# Patient Record
Sex: Male | Born: 1980 | Race: White | Hispanic: No | Marital: Single | Smoking: Never smoker
Health system: Southern US, Community
[De-identification: ages and names within clinical notes are randomized; demographics above are authoritative.]

## PROBLEM LIST (undated history)

## (undated) DIAGNOSIS — T7840XA Allergy, unspecified, initial encounter: Secondary | ICD-10-CM

## (undated) DIAGNOSIS — F32A Depression, unspecified: Secondary | ICD-10-CM

## (undated) DIAGNOSIS — I10 Essential (primary) hypertension: Secondary | ICD-10-CM

## (undated) DIAGNOSIS — F419 Anxiety disorder, unspecified: Secondary | ICD-10-CM

## (undated) HISTORY — DX: Allergy, unspecified, initial encounter: T78.40XA

## (undated) HISTORY — DX: Depression, unspecified: F32.A

## (undated) HISTORY — DX: Anxiety disorder, unspecified: F41.9

## (undated) HISTORY — PX: COLONOSCOPY: SHX174

## (undated) HISTORY — DX: Essential (primary) hypertension: I10

---

## 2022-01-08 LAB — QUANTIFERON-TB GOLD PLUS: QuantiFERON-TB Gold Plus: POSITIVE

## 2022-01-17 ENCOUNTER — Ambulatory Visit (HOSPITAL_COMMUNITY)
Admission: RE | Admit: 2022-01-17 | Discharge: 2022-01-17 | Disposition: A | Discharge status: Home / Self Care | Payer: Self-pay | Source: Ambulatory Visit | Attending: Emergency Medicine | Admitting: Emergency Medicine

## 2022-01-17 ENCOUNTER — Other Ambulatory Visit: Payer: Self-pay

## 2022-01-17 ENCOUNTER — Other Ambulatory Visit (HOSPITAL_COMMUNITY): Payer: Self-pay | Admitting: Occupational Medicine

## 2022-01-17 DIAGNOSIS — R7611 Nonspecific reaction to tuberculin skin test without active tuberculosis: Secondary | ICD-10-CM

## 2022-01-20 ENCOUNTER — Other Ambulatory Visit (HOSPITAL_COMMUNITY): Payer: Self-pay | Admitting: Family Medicine

## 2022-01-20 DIAGNOSIS — R7611 Nonspecific reaction to tuberculin skin test without active tuberculosis: Secondary | ICD-10-CM

## 2022-01-22 ENCOUNTER — Telehealth: Payer: Self-pay | Admitting: Internal Medicine

## 2022-01-22 NOTE — Telephone Encounter (Signed)
Hi Dr. Henrene Pastor,  D.O.D  We received a referral from patients PCP for Colitis. Patient has seen a GI provider in [place] however he is requesting a transfer of care because he has moved to [place].   Records were obtained for you to review and advise on scheduling.    Thanks

## 2022-01-24 NOTE — Telephone Encounter (Signed)
GI COURTESY RECORDS REVIEW AS DOD  Records reviewed.   41 yo with UC followed at [place] clinic. Now relocated to [facility] and needs a primary GI Doc.  Reasonable request. As my practice is too busy to accommodate, I would recommend that he establish with one of our newer partners.  Please schedule routine visit with Dr. Lorenso Courier. I will copy her on this correspondence and forward the records to her.  Thanks,   Docia Chuck. Geri Seminole., M.D. [facility] of Gastroenterology

## 2022-01-28 NOTE — Telephone Encounter (Signed)
Spoke with patient and he said he will be due for his Remicade infusions will it be okay if I schedule with an APP your next available is 02/26/22.

## 2022-01-30 ENCOUNTER — Encounter: Payer: Self-pay | Admitting: Gastroenterology

## 2022-01-30 NOTE — Telephone Encounter (Signed)
Called patient to schedule left voicemail. 

## 2022-02-05 ENCOUNTER — Ambulatory Visit: Payer: Self-pay | Admitting: Family Medicine

## 2022-02-12 ENCOUNTER — Ambulatory Visit: Payer: No Typology Code available for payment source | Admitting: Gastroenterology

## 2022-02-12 ENCOUNTER — Telehealth: Payer: Self-pay

## 2022-02-12 ENCOUNTER — Encounter: Payer: Self-pay | Admitting: Gastroenterology

## 2022-02-12 ENCOUNTER — Other Ambulatory Visit: Payer: Self-pay

## 2022-02-12 VITALS — BP 152/88 | HR 96 | Ht 70.0 in | Wt 275.0 lb

## 2022-02-12 DIAGNOSIS — K51818 Other ulcerative colitis with other complication: Secondary | ICD-10-CM

## 2022-02-12 DIAGNOSIS — K51919 Ulcerative colitis, unspecified with unspecified complications: Secondary | ICD-10-CM | POA: Insufficient documentation

## 2022-02-12 DIAGNOSIS — K5190 Ulcerative colitis, unspecified, without complications: Secondary | ICD-10-CM

## 2022-02-12 HISTORY — DX: Ulcerative colitis, unspecified, without complications: K51.90

## 2022-02-12 NOTE — Telephone Encounter (Signed)
Orders placed in Infusion Navigator with [facility] for Entyvio. Pharmacist to begin auth tomorrow (02/12/22). Will await response. ?

## 2022-02-12 NOTE — Telephone Encounter (Signed)
-----   Message from Loralie Champagne, PA-C sent at 02/12/2022  1:04 PM EDT ----- ?I saw this patient in clinic this morning.  He is new to our office, has been on Entyvio infusions for his UC.  His care is being assigned to Dr. Lorenso Courier.  We discussed about possibly switching to Rinvoq as he expressed interest in this, however, I forgot that Dr. Lorenso Courier is out of the country, does not return until 3/27.  That being said, he is already overdue for his Entyvio.  He typically gets that infusion every 4 weeks.  Can we please try to get that set up for him so that he can get restarted on something and then when she returns I will discussed with her regarding possible switch to Rinvoq? ? ?Thank you, ? ?Jess ? ?

## 2022-02-12 NOTE — Progress Notes (Addendum)
? ? ? ?02/12/2022 ?Zachary [name] ?[id number] ?[birth year] ? ? ?HISTORY OF PRESENT ILLNESS: This is a 41 year old male who has ulcerative colitis.  He is coming to our office to establish care as he recently moved here from [place], now works for [employer].  He was following with [facility], Dr. Martina Sinner.  He was diagnosed with ulcerative colitis in 2019.  Initial colonoscopy showed severely active chronic colitis from the rectum to the transverse colon.  He was initially started on mesalamine and then Humira, which he says he took for about 1.5 years, however he developed antibodies and this was stopped.  He was then switched to [facility] around January 2021 at which time he was also hospitalized for his symptoms.  He tells me colonoscopy was repeated at that time.  He says that he does not feel like the Entyvio ever did as well for him as the Humira had.  In September 2022 his Entyvio levels were found to be low so his interval was decreased from every 8 weeks to every 4 weeks.  He was also placed on Uceris 9 mg daily and has continued on ever since that time.  His last Entyvio infusion was at the end of January, so he is overdue for that and is beginning to have flares of his symptoms and his disease.  He is interested and open to discussion in regards to switching to Rinvoq though as that is oral and he would rather take something like that if possible. ? ?Recently had QuantiFERON gold drawn for [employer] employment and we did receive those results.  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 ? ?He tells me that he is having several bowel movements a day, some diarrhea, some normal.  He is not seeing any blood and he is not having any abdominal pain.   ? ?I reviewed records from [place].  I do not have either of his colonoscopy records as I believe those were both performed at his  initial place of care, prior to switching to [facility] so we will work on getting those results. ? ? ?Past Medical History:  ?Diagnosis Date  ? Hypertension   ? Ulcerative colitis ([place])   ? ? reports that he has never smoked. He has been exposed to tobacco smoke. He has quit using smokeless tobacco. He reports current alcohol use. He reports that he does not use drugs. ?family history includes COPD in his mother; Colon cancer in his cousin; Healthy in his father; Ovarian cancer in his paternal aunt. ?No Known Allergies ? ?  ?Outpatient Encounter Medications as of 02/12/2022  ?Medication Sig  ? amLODipine (NORVASC) 10 MG tablet Take 1 tablet by mouth daily.  ? Budesonide ER 9 MG TB24 Take 1 tablet by mouth daily at 12 noon.  ? lisinopril (ZESTRIL) 40 MG tablet Take 1 tablet by mouth daily at 12 noon.  ? Multiple Vitamin (MULTI-VITAMIN) tablet Take 1 tablet by mouth daily.  ? sertraline (ZOLOFT) 50 MG tablet Take 2 tablets by mouth daily.  ? vedolizumab (ENTYVIO) 300 MG injection Inject 5 mLs into the vein every 30 (thirty) days. Every 4 weeks.  ? ?No facility-administered encounter medications on file as of 02/12/2022.  ? ? ? ?REVIEW OF SYSTEMS  : All other systems reviewed and negative except where noted in the History of Present Illness. ? ? ?  PHYSICAL EXAM: ?BP (!) 152/88   Pulse 96   Ht 5' 10"  (1.778 m)   Wt 275 lb (124.7 kg)   BMI 39.46 kg/m?  ?General: Well developed white male in no acute distress ?Head: Normocephalic and atraumatic ?Eyes:  Sclerae anicteric, conjunctiva pink. ?Ears: Normal auditory acuity ?Lungs: Clear throughout to auscultation; no W/R/R. ?Heart: Regular rate and rhythm; no M/R/G. ?Abdomen: Soft, non-distended.  BS present.  Non-tender. ?Musculoskeletal: Symmetrical with no gross deformities  ?Skin: No lesions on visible extremities ?Extremities: No edema  ?Neurological: Alert oriented x 4, grossly non-focal ?Psychological:  Alert and cooperative. Normal mood and  affect ? ?ASSESSMENT AND PLAN: ?*Ulcerative colitis on Entyvio, initially every 8 weeks starting around January 2021, but then [facility] level/titers were low back in September/October 2022 so the interval was decreased to every 4 weeks (I see notes from the physician saying they were low, but I cannot see the actual results).  He has also been on budesonide since that time as well.  Last Entyvio infusion was towards the end of January, so he is overdue for that.  Now starting to have more symptoms again.  He expressed interest in possibly trying Rinvoq as it is oral.  He is not sure that he has ever done extremely well on the Entyvio.  Has now been on budesonide as well since October.  Dr. Lorenso Courier is out of the country for the next couple weeks.  I am going to go ahead and initiate trying to get him back on Entyvio so he can get his infusion and then I will discuss with her further regarding switching to something different, possibly Rinvoq, when she returns. ? ?**Addendum: I did receive previous colonoscopy records.  Not typical colonoscopy reports, but April 2019 they reported colitis with biopsy showing moderate to severe chronic active colitis with ulceration.  They describe crypt distortion associated with increased lymphocytes, plasma cells, and lymphoid aggregates within the lamina propria. ?Colonoscopy from January 21 for evaluation of ulcerative colitis showed colitis with random biopsy showing diffuse colitis with neutrophilic cryptitis, distortion of crypt architecture, moderate mononuclear cell expansion of the lamina propria and basal plasmacytosis consistent with moderately active ulcerative colitis.  No dysplasia present.  Records are being sent for scanning. ? ? ?CC:  No ref. provider found ? ?  ?

## 2022-02-12 NOTE — Patient Instructions (Signed)
If you are age 42 or older, your body mass index should be between 23-30. Your Body mass index is 39.46 kg/m?Marland Kitchen If this is out of the aforementioned range listed, please consider follow up with your Primary Care Provider. ? ?If you are age 46 or younger, your body mass index should be between 19-25. Your Body mass index is 39.46 kg/m?Marland Kitchen If this is out of the aformentioned range listed, please consider follow up with your Primary Care Provider.  ? ? ?The [facility] providers would like to encourage you to use [facility] to communicate with providers for non-urgent requests or questions.  Due to long hold times on the telephone, sending your provider a message by [facility] may be a faster and more efficient way to get a response.  Please allow 48 business hours for a response.  Please remember that this is for non-urgent requests.  ? ?It was a pleasure to see you today! ? ?Thank you for trusting me with your gastrointestinal care!   ? ?Alonza Bogus, PA-C  ?

## 2022-02-12 NOTE — Telephone Encounter (Signed)
Ammie please see the note from Alonza Bogus PA.  She would like this pt to get Weyman Rodney (he is overdue) He will be Dr Libby Maw pt.   ?

## 2022-02-13 ENCOUNTER — Telehealth: Payer: Self-pay | Admitting: Pharmacy Technician

## 2022-02-13 ENCOUNTER — Other Ambulatory Visit: Payer: Self-pay | Admitting: Family Medicine

## 2022-02-13 ENCOUNTER — Other Ambulatory Visit: Payer: Self-pay | Admitting: Pharmacy Technician

## 2022-02-13 NOTE — Telephone Encounter (Signed)
Ammie and Janett Billow I have been out of the loop on this pt.  I am not sure what if anything I need to do.  The pt states his previous GI has faxed records.  Let me me know if I need to do anything. ?

## 2022-02-13 NOTE — Telephone Encounter (Signed)
Zachary [name] have you seen the Xray? ?

## 2022-02-13 NOTE — Telephone Encounter (Signed)
Response sent to Koren Shiver, RN ?

## 2022-02-13 NOTE — Telephone Encounter (Signed)
CXR results received. Results have been labeled and placed in scans file for scanning purposes.  ?

## 2022-02-13 NOTE — Telephone Encounter (Signed)
Thank you Ammie- This was a Dr Lorenso Courier pt correct?  I was trying to follow the thread and I think I got lost.  As long as the pt is taken care of.  Thank you  ?

## 2022-02-13 NOTE — Telephone Encounter (Signed)
Auth Submission: PENDING ?Payer: [employer] ?Medication & CPT/J Code(s) submitted: Entyvio (Vedolizumab) [id number] ?Route of submission (phone, fax, portal): PHONE: [phone] ?FAX: [phone] ?Submitted via [place]  ?Clinicals faxed: [phone] ?Auth type: Buy/Bill ?Units/visits requested: entyvio 363m ?Reference number: [id number]? ?Will update once we receive a response. ? ?  ?

## 2022-02-19 ENCOUNTER — Other Ambulatory Visit: Payer: Self-pay | Admitting: Pharmacy Technician

## 2022-02-19 NOTE — Telephone Encounter (Signed)
Notified pharmacist, Tresa Res, for status update. States still pending at this time. Will update once they have received a response. ?

## 2022-02-20 NOTE — Telephone Encounter (Signed)
Zachary [name] has been approved. Pt will be scheduled for infusion by [facility]. Please advise when you would like to see him in follow following infusion and if any labs will be required at the time of his f/u. ?

## 2022-02-20 NOTE — Telephone Encounter (Signed)
Dr. Myrtice Lauth, ?Auth Submission: APPROVED ?Payer: [employer] ?Medication & CPT/J Code(s) submitted: Entyvio (Vedolizumab) [id number] ?Route of submission (phone, fax, portal): PQSOX:[phone] ?Auth type: Buy/Bill ?Units/visits requested: 300 MG Q4WKS ?Reference number: [id number] ?Approval from: 02/19/22 to 05/22/22  ? ?Patient will be scheduled immediately. ?Zachary [name]

## 2022-02-23 NOTE — Progress Notes (Signed)
I agree with the assessment and plan as outlined by Ms. Myrtice Lauth. I recommend continuing the patient on Entyvio for now since I would like to ensure that he has failed Entyvio prior to switching to another medication. If we do confirm that he has failed Entyvio, then would be okay to switch to Rinvoq in the future. ?

## 2022-02-24 ENCOUNTER — Encounter: Payer: Self-pay | Admitting: Family Medicine

## 2022-02-24 ENCOUNTER — Ambulatory Visit: Payer: No Typology Code available for payment source

## 2022-02-24 DIAGNOSIS — F32A Depression, unspecified: Secondary | ICD-10-CM

## 2022-02-24 NOTE — Telephone Encounter (Signed)
Pt has been scheduled for 1 mo f/u with Dr. Lorenso Courier on 03/28/22 @ 3pm. My Chart message sent informing pt of date/time of appt. ?

## 2022-02-25 ENCOUNTER — Ambulatory Visit (INDEPENDENT_AMBULATORY_CARE_PROVIDER_SITE_OTHER): Payer: No Typology Code available for payment source

## 2022-02-25 ENCOUNTER — Other Ambulatory Visit: Payer: Self-pay

## 2022-02-25 VITALS — BP 151/101 | HR 94 | Temp 98.2°F | Resp 18 | Ht 72.0 in | Wt 275.4 lb

## 2022-02-25 DIAGNOSIS — K51819 Other ulcerative colitis with unspecified complications: Secondary | ICD-10-CM | POA: Diagnosis not present

## 2022-02-25 MED ORDER — VEDOLIZUMAB 300 MG IV SOLR
300.0000 mg | Freq: Once | INTRAVENOUS | Status: AC
  Administered 2022-02-25: 300 mg via INTRAVENOUS
  Filled 2022-02-25: qty 5

## 2022-02-25 NOTE — Telephone Encounter (Signed)
Appears pt has viewed My Chart message pertaining to follow up appt as below: ? ?Last read by Genevive Bi "Chris" at  1:25 PM on 02/24/2022. ?

## 2022-02-25 NOTE — Progress Notes (Signed)
Diagnosis: Ulcerative colitis ? ?Provider:  Marshell Garfinkel, MD ? ?Procedure: Infusion ? ?IV Type: Peripheral, IV Location: L Antecubital ? ?Entyvio (Vedolizumab),  Dose: 300 mg ? ?Infusion Start Time: 4818 ? ?Infusion Stop Time: 1139 ? ?Post Infusion IV Care: Peripheral IV Discontinued ? ?Discharge: Condition: Good, Destination: Home . AVS provided to patient.  ? ?Performed by:  Koren Shiver, RN  ?  ?

## 2022-02-26 NOTE — Telephone Encounter (Signed)
Entyvio approved. Pt scheduled for infusion 03/25/22 and f/u with Dr. Lorenso Courier 03/28/22. ?

## 2022-02-26 NOTE — Progress Notes (Signed)
Following message received from Alonza Bogus, PA-C: ? ?Please let him know that Dr. Lorenso Courier would like him to stay on Entyvio for now.  If we feel that he fails that at some point then can try Rinvoq.  Please be sure that he keeps his follow-up with Dr. Lorenso Courier in April.  I am assuming that he got his Entyvio infusion.  How is he feeling?  ? ?Thank you,  ? ?Jess ? ?My Chart message sent as follows: ? ?Hi Zachary [name] ?  ?Dr. Lorenso Courier has reviewed your records and would like you to stay on [facility] for now.  If she feels that this biologic is not treating your Ulcerative Colitis symptoms as adequately as needed, at some point she will recommend Rinvoq.  Please be sure to keep your follow-up appointment with Dr. Lorenso Courier in April. Since your Entyvio infusion on 3/27, how have you been feeling?  ? ?Thank you ? ?Awaiting pt response  ?

## 2022-02-27 ENCOUNTER — Ambulatory Visit: Payer: Self-pay | Admitting: Family Medicine

## 2022-02-27 NOTE — Telephone Encounter (Signed)
Appt has been rescheduled.  ?

## 2022-03-06 ENCOUNTER — Other Ambulatory Visit (HOSPITAL_COMMUNITY): Payer: Self-pay

## 2022-03-06 ENCOUNTER — Encounter: Payer: Self-pay | Admitting: Internal Medicine

## 2022-03-06 MED ORDER — SERTRALINE HCL 50 MG PO TABS
100.0000 mg | ORAL_TABLET | Freq: Every day | ORAL | 0 refills | Status: DC
  Filled 2022-03-06: qty 30, 15d supply, fill #0

## 2022-03-20 ENCOUNTER — Other Ambulatory Visit (HOSPITAL_BASED_OUTPATIENT_CLINIC_OR_DEPARTMENT_OTHER): Payer: Self-pay

## 2022-03-20 ENCOUNTER — Encounter: Payer: Self-pay | Admitting: Internal Medicine

## 2022-03-20 ENCOUNTER — Ambulatory Visit (INDEPENDENT_AMBULATORY_CARE_PROVIDER_SITE_OTHER): Payer: No Typology Code available for payment source | Admitting: Family Medicine

## 2022-03-20 ENCOUNTER — Encounter: Payer: Self-pay | Admitting: Family Medicine

## 2022-03-20 VITALS — BP 147/98 | HR 104 | Ht 72.0 in | Wt 274.2 lb

## 2022-03-20 DIAGNOSIS — F32A Depression, unspecified: Secondary | ICD-10-CM

## 2022-03-20 DIAGNOSIS — E669 Obesity, unspecified: Secondary | ICD-10-CM | POA: Diagnosis not present

## 2022-03-20 DIAGNOSIS — I10 Essential (primary) hypertension: Secondary | ICD-10-CM | POA: Diagnosis not present

## 2022-03-20 DIAGNOSIS — F419 Anxiety disorder, unspecified: Secondary | ICD-10-CM | POA: Diagnosis not present

## 2022-03-20 MED ORDER — WEGOVY 0.25 MG/0.5ML ~~LOC~~ SOAJ
0.2500 mg | SUBCUTANEOUS | 1 refills | Status: DC
  Filled 2022-03-20: qty 2, 28d supply, fill #0

## 2022-03-20 MED ORDER — LISINOPRIL 40 MG PO TABS
60.0000 mg | ORAL_TABLET | Freq: Every day | ORAL | 1 refills | Status: DC
  Filled 2022-03-20: qty 135, 90d supply, fill #0
  Filled 2022-06-17 – 2022-07-24 (×2): qty 135, 90d supply, fill #1

## 2022-03-20 MED ORDER — SERTRALINE HCL 100 MG PO TABS
100.0000 mg | ORAL_TABLET | Freq: Every day | ORAL | 1 refills | Status: DC
  Filled 2022-03-20: qty 90, 90d supply, fill #0
  Filled 2022-05-13 – 2022-06-04 (×2): qty 90, 90d supply, fill #1

## 2022-03-20 NOTE — Assessment & Plan Note (Signed)
Blood pressure is not at goal for age and co-morbidities.  I recommend amlodipine 10 mg daily and lisinopril 60 mg daily.   ?- BP goal <130/80 ?- monitor and log blood pressures at home ?- check around the same time each day in a relaxed setting ?- Limit salt to <2000 mg/day ?- Follow DASH eating plan (heart healthy diet) ?- limit alcohol to 2 standard drinks per day for men and 1 per day for women ?- avoid tobacco products ?- get at least 2 hours of regular aerobic exercise weekly ?Patient aware of signs/symptoms requiring further/urgent evaluation. ?Labs updated today. ?If adding the extra half tab of lisinopril doesn't make much difference at follow-up, then reduce back to 40 mg and consider adding a third agent like hydrochlorothiazide.  ?

## 2022-03-20 NOTE — Assessment & Plan Note (Signed)
Well controlled on Zoloft 100 mg. No SI/HI. Refills provided. No new concerns. ?

## 2022-03-20 NOTE — Patient Instructions (Addendum)
Blood pressure is not at goal for age and co-morbidities.  I recommend amlodipine 10 mg daily and lisinopril 60 mg daily.   ?- BP goal <130/80 ?- monitor and log blood pressures at home ?- check around the same time each day in a relaxed setting ?- Limit salt to <2000 mg/day ?- Follow DASH eating plan (heart healthy diet) ?- limit alcohol to 2 standard drinks per day for men and 1 per day for women ?- avoid tobacco products ?- get at least 2 hours of regular aerobic exercise weekly ?Patient aware of signs/symptoms requiring further/urgent evaluation. ?Labs updated today. ?If adding the extra half tab of lisinopril doesn't make much difference at follow-up, then reduce back to 40 mg and consider adding a third agent like hydrochlorothiazide.  ? ? ? ?Thank you for choosing  Primary Care at [facility] for your Primary Care needs. I am excited for the opportunity to partner with you to meet your health care goals. It was a pleasure meeting you today! ? ? ?Information on diet, exercise, and health maintenance recommendations are listed below. This is information to help you be sure you are on track for optimal health and monitoring.  ? ?Please look over this and let us know if you have any questions or if you have completed any of the health maintenance outside of [place] so that we can be sure your records are up to date.  ?___________________________________________________________ ? ?MyChart:  ?For all urgent or time sensitive needs we ask that you please call the office to avoid delays. Our number is [phone]. ?MyChart is not constantly monitored and due to the large volume of messages a day, replies may take up to 72 business hours. ? ?MyChart Policy: ?MyChart allows for you to see your visit notes, after visit summary, provider recommendations, lab and tests results, make an appointment, request refills, and contact your provider or the office for non-urgent questions or concerns.  Providers are seeing patients during normal business hours and do not have built in time to review MyChart messages.  ?We ask that you allow a minimum of 3 business days for responses to [employer]. For this reason, please do not send urgent requests through [place]. Please call the office at [phone]. ?New and ongoing conditions may require a visit. We have virtual and in-person visits available for your convenience.  ?Complex MyChart concerns may require a visit. Your provider may request you schedule a virtual or in-person visit to ensure we are providing the best care possible. ?MyChart messages sent after 11:00 AM on Friday will not be received by the provider until Monday morning.  ?  ?Lab and Test Results: ?You will receive your lab and test results on MyChart as soon as they are completed and results have been sent by the lab or testing facility. Due to this service, you will receive your results BEFORE your provider.  ?I review lab and test results each morning prior to seeing patients. Some results require collaboration with other providers to ensure you are receiving the most appropriate care. For this reason, we ask that you please allow a minimum of 3-5 business days from the time that ALL results have been received for your provider to receive and review lab and test results and contact you about these.  ?Most lab and test result comments from the provider will be sent through [place]. Your provider may recommend changes to the plan of care, follow-up visits, repeat testing, ask questions, or  request an office visit to discuss these results. You may reply directly to this message or call the office to provide information for the provider or set up an appointment. ?In some instances, you will be called with test results and recommendations. Please let us know if this is preferred and we will make note of this in your chart to provide this for you.    ?If you have not heard a response to your  lab or test results in 5 business days from all results returning to [place], please call the office to let us know. We ask that you please avoid calling prior to this time unless there is an emergent concern. Due to high call volumes, this can delay the resulting process. ? ?After Hours: ?For all non-emergency after hours needs, please call the office at [phone] and select the option to reach the on-call  service. On-call services are shared between multiple [place] offices and therefore it will not be possible to speak directly with your provider. On-call providers may provide medical advice and recommendations, but are unable to provide refills for maintenance medications.  ?For all emergency or urgent medical needs after normal business hours, we recommend that you seek care at the closest Urgent Care or Emergency Department to ensure appropriate treatment in a timely manner.  ?[facility] [place] has a 24 hour emergency room located on the ground floor for your convenience.  ? ?Urgent Concerns During the Business Day ?Providers are seeing patients from 8AM to [place] with a busy schedule and are most often not able to respond to non-urgent calls until the end of the day or the next business day. ?If you should have URGENT concerns during the day, please call and speak to the nurse or schedule a same day appointment so that we can address your concern without delay.  ? ?Thank you, again, for choosing me as your health care partner. I appreciate your trust and look forward to learning more about you.  ? ?Purcell Nails. Olevia Bowens, DNP, FNP-C ? ?___________________________________________________________ ? ?Health Maintenance Recommendations ?Screening Testing ?Mammogram ?Every 1-2 years based on history and risk factors ?Starting at age 53 ?Pap Smear ?Ages 21-39 every 3 years ?Ages 2-65 every 5 years with HPV testing ?More frequent testing may be required based on results and history ?Colon Cancer  Screening ?Every 1-10 years based on test performed, risk factors, and history ?Starting at age 12 ?Bone Density Screening ?Every 2-10 years based on history ?Starting at age 60 for women ?Recommendations for men differ based on medication usage, history, and risk factors ?AAA Screening ?One time ultrasound ?Men 80-55 years old who have ever smoked ?Lung Cancer Screening ?Low Dose Lung CT every 12 months ?Age 13-80 years with a 20 pack-year smoking history who still smoke or who have quit within the last 15 years ? ?Screening Labs ?Routine  Labs: Complete Blood Count (CBC), Complete Metabolic Panel (CMP), Cholesterol (Lipid Panel) ?Every 6-12 months based on history and medications ?May be recommended more frequently based on current conditions or previous results ?Hemoglobin A1c Lab ?Every 3-12 months based on history and previous results ?Starting at age 51 or earlier with diagnosis of diabetes, high cholesterol, BMI >26, and/or risk factors ?Frequent monitoring for patients with diabetes to ensure blood sugar control ?Thyroid Panel (TSH w/ T3 & T4) ?Every 6 months based on history, symptoms, and risk factors ?May be repeated more often if on medication ?HIV ?One time testing for all patients 25 and older ?  May be repeated more frequently for patients with increased risk factors or exposure ?Hepatitis C ?One time testing for all patients 46 and older ?May be repeated more frequently for patients with increased risk factors or exposure ?Gonorrhea, Chlamydia ?Every 12 months for all sexually active persons 13-24 years ?Additional monitoring may be recommended for those who are considered high risk or who have symptoms ?PSA ?Men 52-34 years old with risk factors ?Additional screening may be recommended from age 13-69 based on risk factors, symptoms, and history ? ?Vaccine Recommendations ?Tetanus Booster ?All adults every 10 years ?Flu Vaccine ?All patients 6 months and older every year ?COVID Vaccine ?All patients 12  years and older ?Initial dosing with booster ?May recommend additional booster based on age and health history ?HPV Vaccine ?2 doses all patients age 13-26 ?Dosing may be considered for patients over 26 ?Shingles

## 2022-03-20 NOTE — Assessment & Plan Note (Signed)
He has been working on [employer], portion control and exercise without much improvement yet. Updating labs today. Interested in trying Wegovy - will send in (handout provided).  ?

## 2022-03-20 NOTE — Progress Notes (Signed)
? ?New Patient Office Visit ? ?Subjective   ? ?Patient ID: Zachary [name], male    DOB: 1981-09-04  Age: 41 y.o. MRN: [id number] ? ?CC:  ?Chief Complaint  ?Patient presents with  ? Establish Care  ? Hypertension  ? ? ?HPI ?[name] presents to establish care. 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  ? ?HTN: Patient reports history of HTN and currently taking amlodipine 10 mg daily and lisinopril 40 mg daily. States he has not been monitoring regularly at home, but has been getting high readings at recent doctor's appointments. Patient denies any chest pain, palpitations, dyspnea, wheezing, edema, recurrent headaches, vision changes.  ? ?Anxiety/depression: Patient reports history of anxiety/depression that worsened after his mom died last year. He has been taking 100 mg Zoloft and feels like this helping significantly. No side effects. He denies any SI/HI.  ? ?Obesity: Patient reports he has gained a lot of weight over the past few years with slacking off on diet/exercise and grieving his mom's death. He has started back trying to focus on healthy diet and exercise, but feels that he may need a little help. He is motivated to get back in shape and be able to run a 5K eventually.  ?  ? ? ? ?Outpatient Encounter Medications as of 03/20/2022  ?Medication Sig  ? amLODipine (NORVASC) 10 MG tablet Take 1 tablet by mouth daily.  ? Budesonide ER 9 MG TB24 Take 1 tablet by mouth daily at 12 noon.  ? Multiple Vitamin (MULTI-VITAMIN) tablet Take 1 tablet by mouth daily.  ? Semaglutide-Weight Management (WEGOVY) 0.25 MG/0.5ML SOAJ Inject 0.25 mg into the skin once a week. After 4 weeks, increase to 0.5 mg weekly.  ? vedolizumab (ENTYVIO) 300 MG injection Inject 5 mLs into the vein every 30 (thirty) days. Every 4 weeks.  ? [DISCONTINUED] lisinopril (ZESTRIL) 40 MG tablet Take 1 tablet by mouth daily at 12 noon.  ? [DISCONTINUED] sertraline (ZOLOFT) 50 MG tablet Take 2  tablets (100 mg total) by mouth daily for 15 days.  ? lisinopril (ZESTRIL) 40 MG tablet Take 1 & 1/2 tablets (60 mg total) by mouth daily at 12 noon.  ? sertraline (ZOLOFT) 100 MG tablet Take 1 tablet (100 mg total) by mouth daily.  ? ?No facility-administered encounter medications on file as of 03/20/2022.  ? ? ?Past Medical History:  ?Diagnosis Date  ? Hypertension   ? Ulcerative colitis ([place])   ? Ulcerative colitis ([place]) 02/12/2022  ? ? ?Past Surgical History:  ?Procedure Laterality Date  ? COLONOSCOPY    ? ? ?Family History  ?Problem Relation Age of Onset  ? COPD Mother   ? Healthy Father   ? Ovarian cancer Paternal Aunt   ? Colon cancer Cousin   ? Colon polyps Neg Hx   ? ? ?Social History  ? ?Socioeconomic History  ? Marital status: Single  ?  Spouse name: Not on file  ? Number of children: Not on file  ? Years of education: Not on file  ? Highest education level: Not on file  ?Occupational History  ? Not on file  ?Tobacco Use  ? Smoking status: Never  ?  Passive exposure: Past  ? Smokeless tobacco: Former  ?Vaping Use  ? Vaping Use: Never used  ?Substance and Sexual Activity  ? Alcohol use: Yes  ?  Comment: occasional  ? Drug use: Never  ? Sexual activity: Not on file  ?Other Topics Concern  ?  Not on file  ?Social History Narrative  ? Not on file  ? ?Social Determinants of Health  ? ?Financial Resource Strain: Not on file  ?Food Insecurity: Not on file  ?Transportation Needs: Not on file  ?Physical Activity: Not on file  ?Stress: Not on file  ?Social Connections: Not on file  ?Intimate Partner Violence: Not on file  ? ? ?ROS ?All review of systems negative except what is listed in the HPI ? ?  ? ? ?Objective   ? ?BP (!) 147/98   Pulse (!) 104   Ht 6' (1.829 m)   Wt 274 lb 3.2 oz (124.4 kg)   BMI 37.19 kg/m?  ? ?Physical Exam ?Vitals reviewed.  ?Constitutional:   ?   General: He is not in acute distress. ?   Appearance: Normal appearance. He is obese. He is not ill-appearing.  ?HENT:  ?   Head: Normocephalic  and atraumatic.  ?Cardiovascular:  ?   Rate and Rhythm: Normal rate and regular rhythm.  ?Pulmonary:  ?   Effort: Pulmonary effort is normal.  ?   Breath sounds: Normal breath sounds.  ?Skin: ?   General: Skin is warm and dry.  ?Neurological:  ?   General: No focal deficit present.  ?   Mental Status: He is alert and oriented to person, place, and time. Mental status is at baseline.  ?Psychiatric:     ?   Mood and Affect: Mood normal.     ?   Behavior: Behavior normal.     ?   Thought Content: Thought content normal.     ?   Judgment: Judgment normal.  ? ? ? ?  ? ?Assessment & Plan:  ? ?1. Anxiety and depression ?Well controlled on Zoloft 100 mg. No SI/HI. Refills provided. No new concerns. ?- sertraline (ZOLOFT) 100 MG tablet; Take 1 tablet (100 mg total) by mouth daily.  Dispense: 90 tablet; Refill: 1 ? ?2. Primary hypertension ?Blood pressure is not at goal for age and co-morbidities.  I recommend amlodipine 10 mg daily and lisinopril 60 mg daily.   ?- BP goal <130/80 ?- monitor and log blood pressures at home ?- check around the same time each day in a relaxed setting ?- Limit salt to <2000 mg/day ?- Follow DASH eating plan (heart healthy diet) ?- limit alcohol to 2 standard drinks per day for men and 1 per day for women ?- avoid tobacco products ?- get at least 2 hours of regular aerobic exercise weekly ?Patient aware of signs/symptoms requiring further/urgent evaluation. ?Labs updated today. ?If adding the extra half tab of lisinopril doesn't make much difference at follow-up, then reduce back to 40 mg and consider adding a third agent like hydrochlorothiazide.  ? ?- Hemoglobin A1c ?- CBC ?- Comprehensive metabolic panel ?- Lipid panel ?- TSH ?- lisinopril (ZESTRIL) 40 MG tablet; Take 1 & 1/2 tablets (60 mg total) by mouth daily at 12 noon.  Dispense: 135 tablet; Refill: 1 ? ?3. Obesity (BMI 35.0-39.9 without comorbidity) ?He has been working on [employer], portion control and exercise without much  improvement yet. Updating labs today. Interested in trying Wegovy - will send in (handout provided).  ? ?- Hemoglobin A1c ?- CBC ?- Comprehensive metabolic panel ?- Lipid panel ?- TSH ?- Semaglutide-Weight Management (WEGOVY) 0.25 MG/0.5ML SOAJ; Inject 0.25 mg into the skin once a week. After 4 weeks, increase to 0.5 mg weekly.  Dispense: 2 mL; Refill: 1 ? ? ? ?Return in about 2 weeks (  around 04/03/2022) for HTN f/u Lovena Le .  ? ?Terrilyn Saver, NP ? ? ?

## 2022-03-21 ENCOUNTER — Telehealth: Payer: Self-pay

## 2022-03-21 ENCOUNTER — Other Ambulatory Visit (HOSPITAL_BASED_OUTPATIENT_CLINIC_OR_DEPARTMENT_OTHER): Payer: Self-pay

## 2022-03-21 LAB — LIPID PANEL
Cholesterol: 169 mg/dL (ref 0–200)
HDL: 53.6 mg/dL (ref >39.00)
LDL Cholesterol: 76 mg/dL (ref 0–99)
NonHDL: 115.67
Total CHOL/HDL Ratio: 3
Triglycerides: 200 mg/dL — ABNORMAL HIGH (ref 0.0–149.0)
VLDL: 40 mg/dL (ref 0.0–40.0)

## 2022-03-21 LAB — COMPREHENSIVE METABOLIC PANEL
ALT: 33 U/L (ref 0–53)
AST: 29 U/L (ref 0–37)
Albumin: 4.1 g/dL (ref 3.5–5.2)
Alkaline Phosphatase: 50 U/L (ref 39–117)
BUN: 26 mg/dL — ABNORMAL HIGH (ref 6–23)
CO2: 24 mEq/L (ref 19–32)
Calcium: 9.3 mg/dL (ref 8.4–10.5)
Chloride: 103 mEq/L (ref 96–112)
Creatinine, Ser: 1.25 mg/dL (ref 0.40–1.50)
GFR: 71.04 mL/min (ref >60.00)
Glucose, Bld: 80 mg/dL (ref 70–99)
Potassium: 3.5 mEq/L (ref 3.5–5.1)
Sodium: 139 mEq/L (ref 135–145)
Total Bilirubin: 0.5 mg/dL (ref 0.2–1.2)
Total Protein: 7.2 g/dL (ref 6.0–8.3)

## 2022-03-21 LAB — CBC
HCT: 41.2 % (ref 39.0–52.0)
Hemoglobin: 13.5 g/dL (ref 13.0–17.0)
MCHC: 32.8 g/dL (ref 30.0–36.0)
MCV: 94.7 fl (ref 78.0–100.0)
Platelets: 324 10*3/uL (ref 150.0–400.0)
RBC: 4.35 Mil/uL (ref 4.22–5.81)
RDW: 13.2 % (ref 11.5–15.5)
WBC: 11.6 10*3/uL — ABNORMAL HIGH (ref 4.0–10.5)

## 2022-03-21 LAB — TSH: TSH: 1.15 u[IU]/mL (ref 0.35–5.50)

## 2022-03-21 LAB — HEMOGLOBIN A1C: Hgb A1c MFr Bld: 6.1 % (ref 4.6–6.5)

## 2022-03-21 NOTE — Telephone Encounter (Signed)
PA initiated via Covermymeds; KEY: [id number]. Awaiting determination.  ?

## 2022-03-24 ENCOUNTER — Other Ambulatory Visit (HOSPITAL_BASED_OUTPATIENT_CLINIC_OR_DEPARTMENT_OTHER): Payer: Self-pay

## 2022-03-24 NOTE — Telephone Encounter (Signed)
PA approved.  ? ? The request has been approved. The authorization is effective for a maximum of 5 fills from 03/22/2022 to 08/21/2022, as long as the member is enrolled in their current health plan. The request was approved as submitted. This request has been approved for 61m per 28 days.Additional authorizations have been created for the following:Wegovy 0.570m0.5mL allowing 60m360mer 28 days; please reference authorization [id number] 1mg45m5mL allowing 60mL 19m 28 days; please reference authorization [id number] 1.7mg/031mmL allowing 3mL pe460m8 day; please reference authorization [id number] 2.4mg/0.768m allowing 3mL per 45mdays; please reference authorization [id number]. A [phone] notification letter will follow with additional details. ?

## 2022-03-25 ENCOUNTER — Ambulatory Visit (INDEPENDENT_AMBULATORY_CARE_PROVIDER_SITE_OTHER): Payer: No Typology Code available for payment source

## 2022-03-25 VITALS — BP 146/98 | HR 97 | Temp 98.2°F | Resp 20 | Ht 72.0 in | Wt 271.0 lb

## 2022-03-25 DIAGNOSIS — K51819 Other ulcerative colitis with unspecified complications: Secondary | ICD-10-CM | POA: Diagnosis not present

## 2022-03-25 MED ORDER — VEDOLIZUMAB 300 MG IV SOLR
300.0000 mg | Freq: Once | INTRAVENOUS | Status: AC
  Administered 2022-03-25: 300 mg via INTRAVENOUS
  Filled 2022-03-25: qty 5

## 2022-03-25 NOTE — Progress Notes (Signed)
Diagnosis: Ulcerative Colitis ? ?Provider:  Marshell Garfinkel, MD ? ?Procedure: Infusion ? ?IV Type: Peripheral, IV Location: L Antecubital ? ?Entyvio (Vedolizumab),  Dose: 300 mg ? ?Infusion Start Time: 0911 ? ?Infusion Stop Time: 4081 ? ?Post Infusion IV Care: Peripheral IV Discontinued ? ?Discharge: Condition: Good, Destination: Home . AVS provided to patient.  ? ?Performed by:  Cleophus Molt, RN  ?  ?

## 2022-03-28 ENCOUNTER — Ambulatory Visit: Payer: No Typology Code available for payment source | Admitting: Internal Medicine

## 2022-03-31 NOTE — Telephone Encounter (Signed)
Zachary Billow do you want the pt to keep the 5/17 appt with Dr Lorenso Courier to discuss changes in treatment?   ?

## 2022-04-13 ENCOUNTER — Encounter: Payer: Self-pay | Admitting: Family Medicine

## 2022-04-14 ENCOUNTER — Other Ambulatory Visit: Payer: Self-pay | Admitting: Family Medicine

## 2022-04-14 ENCOUNTER — Other Ambulatory Visit: Payer: Self-pay

## 2022-04-14 ENCOUNTER — Other Ambulatory Visit (HOSPITAL_COMMUNITY): Payer: Self-pay

## 2022-04-14 MED ORDER — BUDESONIDE 3 MG PO CPEP
9.0000 mg | ORAL_CAPSULE | Freq: Every day | ORAL | 3 refills | Status: DC
  Filled 2022-04-14: qty 81, 27d supply, fill #0
  Filled 2022-04-14: qty 9, 3d supply, fill #0
  Filled 2022-05-04: qty 90, 30d supply, fill #1
  Filled 2022-06-04 – 2022-06-17 (×3): qty 90, 30d supply, fill #2
  Filled 2022-07-07: qty 90, 30d supply, fill #3

## 2022-04-14 MED ORDER — WEGOVY 0.5 MG/0.5ML ~~LOC~~ SOAJ
0.5000 mg | SUBCUTANEOUS | 0 refills | Status: DC
  Filled 2022-04-14 – 2022-05-04 (×3): qty 2, 28d supply, fill #0

## 2022-04-15 ENCOUNTER — Other Ambulatory Visit: Payer: Self-pay | Admitting: Family Medicine

## 2022-04-15 ENCOUNTER — Other Ambulatory Visit (HOSPITAL_COMMUNITY): Payer: Self-pay

## 2022-04-15 DIAGNOSIS — E669 Obesity, unspecified: Secondary | ICD-10-CM

## 2022-04-16 ENCOUNTER — Other Ambulatory Visit: Payer: Self-pay

## 2022-04-16 ENCOUNTER — Other Ambulatory Visit (INDEPENDENT_AMBULATORY_CARE_PROVIDER_SITE_OTHER): Payer: No Typology Code available for payment source

## 2022-04-16 ENCOUNTER — Telehealth: Payer: Self-pay

## 2022-04-16 ENCOUNTER — Ambulatory Visit: Payer: No Typology Code available for payment source | Admitting: Internal Medicine

## 2022-04-16 ENCOUNTER — Encounter: Payer: Self-pay | Admitting: Internal Medicine

## 2022-04-16 ENCOUNTER — Other Ambulatory Visit: Payer: Self-pay | Admitting: Family Medicine

## 2022-04-16 ENCOUNTER — Other Ambulatory Visit (HOSPITAL_COMMUNITY): Payer: Self-pay

## 2022-04-16 ENCOUNTER — Other Ambulatory Visit: Payer: Self-pay | Admitting: *Deleted

## 2022-04-16 VITALS — BP 130/70 | HR 65 | Ht 73.0 in | Wt 261.0 lb

## 2022-04-16 DIAGNOSIS — Z227 Latent tuberculosis: Secondary | ICD-10-CM | POA: Diagnosis not present

## 2022-04-16 DIAGNOSIS — K51919 Ulcerative colitis, unspecified with unspecified complications: Secondary | ICD-10-CM | POA: Diagnosis not present

## 2022-04-16 DIAGNOSIS — E669 Obesity, unspecified: Secondary | ICD-10-CM

## 2022-04-16 LAB — CBC WITH DIFFERENTIAL/PLATELET
Basophils Absolute: 0.1 10*3/uL (ref 0.0–0.1)
Basophils Relative: 1 % (ref 0.0–3.0)
Eosinophils Absolute: 0.8 10*3/uL — ABNORMAL HIGH (ref 0.0–0.7)
Eosinophils Relative: 8.5 % — ABNORMAL HIGH (ref 0.0–5.0)
HCT: 42.3 % (ref 39.0–52.0)
Hemoglobin: 14.3 g/dL (ref 13.0–17.0)
Lymphocytes Relative: 18.6 % (ref 12.0–46.0)
Lymphs Abs: 1.7 10*3/uL (ref 0.7–4.0)
MCHC: 33.8 g/dL (ref 30.0–36.0)
MCV: 92.7 fl (ref 78.0–100.0)
Monocytes Absolute: 0.9 10*3/uL (ref 0.1–1.0)
Monocytes Relative: 10.6 % (ref 3.0–12.0)
Neutro Abs: 5.5 10*3/uL (ref 1.4–7.7)
Neutrophils Relative %: 61.3 % (ref 43.0–77.0)
Platelets: 326 10*3/uL (ref 150.0–400.0)
RBC: 4.56 Mil/uL (ref 4.22–5.81)
RDW: 13.3 % (ref 11.5–15.5)
WBC: 8.9 10*3/uL (ref 4.0–10.5)

## 2022-04-16 LAB — COMPREHENSIVE METABOLIC PANEL
ALT: 16 U/L (ref 0–53)
AST: 15 U/L (ref 0–37)
Albumin: 4.1 g/dL (ref 3.5–5.2)
Alkaline Phosphatase: 49 U/L (ref 39–117)
BUN: 11 mg/dL (ref 6–23)
CO2: 26 mEq/L (ref 19–32)
Calcium: 9.4 mg/dL (ref 8.4–10.5)
Chloride: 104 mEq/L (ref 96–112)
Creatinine, Ser: 1.06 mg/dL (ref 0.40–1.50)
GFR: 86.54 mL/min (ref >60.00)
Glucose, Bld: 106 mg/dL — ABNORMAL HIGH (ref 70–99)
Potassium: 3.4 mEq/L — ABNORMAL LOW (ref 3.5–5.1)
Sodium: 139 mEq/L (ref 135–145)
Total Bilirubin: 0.4 mg/dL (ref 0.2–1.2)
Total Protein: 7.5 g/dL (ref 6.0–8.3)

## 2022-04-16 LAB — C-REACTIVE PROTEIN: CRP: <1 mg/dL (ref 0.5–20.0)

## 2022-04-16 MED ORDER — RINVOQ 15 MG PO TB24
15.0000 mg | ORAL_TABLET | Freq: Every day | ORAL | 2 refills | Status: DC
  Filled 2022-04-16: qty 30, fill #0

## 2022-04-16 MED ORDER — RINVOQ 45 MG PO TB24
45.0000 mg | ORAL_TABLET | Freq: Every day | ORAL | 0 refills | Status: DC
  Filled 2022-04-16: qty 56, fill #0

## 2022-04-16 MED ORDER — WEGOVY 0.25 MG/0.5ML ~~LOC~~ SOAJ
0.2500 mg | SUBCUTANEOUS | 1 refills | Status: DC
  Filled 2022-04-16: qty 2, 28d supply, fill #0
  Filled 2022-05-10: qty 2, 28d supply, fill #1

## 2022-04-16 NOTE — Patient Instructions (Addendum)
If you are age 43 or older, your body mass index should be between 23-30. Your Body mass index is 34.43 kg/m?Marland Kitchen If this is out of the aforementioned range listed, please consider follow up with your Primary Care Provider. ? ?If you are age 55 or younger, your body mass index should be between 19-25. Your Body mass index is 34.43 kg/m?Marland Kitchen If this is out of the aformentioned range listed, please consider follow up with your Primary Care Provider.  ? ?Your provider has requested that you go to the basement level for lab work before leaving today. Press "B" on the elevator. The lab is located at the first door on the left as you exit the elevator.  ? ?Stop Entivio  ? ?[facility] go to [facility] for the shingles Vaccine. ? ?Due to recent changes in healthcare laws, you may see the results of your imaging and laboratory studies on MyChart before your provider has had a chance to review them.  We understand that in some cases there may be results that are confusing or concerning to you. Not all laboratory results come back in the same time frame and the provider may be waiting for multiple results in order to interpret others.  Please give [place] 48 hours in order for your provider to thoroughly review all the results before contacting the office for clarification of your results.  ? ? ?The [facility] providers would like to encourage you to use [facility] to communicate with providers for non-urgent requests or questions.  Due to long hold times on the telephone, sending your provider a message by [facility] may be a faster and more efficient way to get a response.  Please allow 48 business hours for a response.  Please remember that this is for non-urgent requests.  ? ?It was a pleasure to see you today! ? ?Thank you for trusting me with your gastrointestinal care!   ? ?Christia Reading, MD ?

## 2022-04-16 NOTE — Telephone Encounter (Signed)
Patient Advocate Encounter ? ?Received notification from [employer] that prior authorization for [facility] 45MG AND 15MG is required. ? ?PA submitted on 5.17.23 ?45MG KEY [id number] ?15MG KEY [id number] (cancelled due to processing 70m) ?Status is pending ? ?[facility] will continue to follow ? ?Deshannon Hinchliffe R Ryllie Nieland, CPhT ?Patient Advocate ?Phone: [phone]? ?

## 2022-04-16 NOTE — Progress Notes (Addendum)
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  INTERVAL HISTORY: Patient has restarted his Entyvio therapy. He had infusions on 02/25/22 and 03/25/22, but does not think that the Entyvio has helped at all. He may feel slightly different a day after his infusion, but otherwise does not notice a significant difference in his symptoms. His next Entyvio infusion is due next Tuesday. Last week he felt particularly poorly after he came off of his most recent budesonide taper. He felt lightheaded. He has since restarted on the budesonide and feels better. He is currently still going to the bathroom about 10-15 times per day due to fecal urgency. However, he is only having about 5 BMs per day total. He does still see blood in his stools. He states that ever since he has been on Entyvio, he has not felt like his UC has been under control. He felt much better when he was on Humira in the past (like he didn't even have UC anymore), but unfortunately he subsequently developed antibodies against the Humira (found when he developed a flare) and had to be switched. He was initially on Entyvio Q8 weeks then was switched to Q4 weeks to see if the more frequent dosing would help with his symptoms. However, he has still not had any any benefit even on the more frequent dosing. Thus he would like to switch to another medication if possible. Since his last GI clinic visit, he was started on Wegovy, which has been helping with weight loss goals. His appetite has significantly decreased. He is on average having 1 meal per day currently. He denies any arthralgias, vision changes, rashes, or mucosal ulcers. He has never been treated for TB in the past.  IBD CHARACTERISTICS: Diagnosis: Ulcerative colitis Year of diagnosis: 2019 Location: Pancolitis EIM: No Prior surgeries:  No Prior medications: Mesalamine, Humira (developed antibodies), budesonide (on-and-off), Entyvio (12/2019-03/2022, never achieved clinical remission) Last flare: 12/2019   Outpatient Encounter Medications as of 04/16/2022  Medication Sig   amLODipine (NORVASC) 10 MG tablet Take 1 tablet by mouth daily.   budesonide (ENTOCORT EC) 3 MG 24 hr capsule Take 3 capsules (9 mg total) by mouth daily.   Budesonide ER 9 MG TB24 Take 1 tablet by mouth daily at 12 noon.   lisinopril (ZESTRIL) 40 MG tablet Take 1 & 1/2 tablets (60 mg total) by mouth daily at 12 noon.   Multiple Vitamin (MULTI-VITAMIN) tablet Take 1 tablet by mouth daily.   Semaglutide-Weight Management (WEGOVY) 0.5 MG/0.5ML SOAJ Inject 0.5 mg into the skin once a week.   sertraline (ZOLOFT) 100 MG tablet Take 1 tablet (100 mg total) by mouth daily.   vedolizumab (ENTYVIO) 300 MG injection Inject 5 mLs into the vein every 30 (thirty) days. Every 4 weeks.   No facility-administered encounter medications on file as of 04/16/2022.   PHYSICAL EXAM: BP 130/70   Pulse 65   Ht 6' 1"  (1.854 m)   Wt 261 lb (118.4 kg)   BMI 34.43 kg/m  General: Well developed white male in no acute distress Head: Normocephalic and atraumatic Eyes:  Sclerae anicteric, conjunctiva pink. Ears: Normal auditory acuity Lungs: Clear throughout to auscultation; no W/R/R. Heart: Regular rate and rhythm; no M/R/G. Abdomen: Soft, non-distended.  BS present.  Non-tender. Musculoskeletal: Symmetrical with no gross deformities  Skin: No lesions on visible extremities Extremities: No edema  Neurological:  Alert oriented x 4, grossly non-focal Psychological:  Alert and cooperative. Normal mood and affect  Labs 01/2022: Quant gold positive.  Labs 03/2022: CBC with elevated WBC of 11.6. CMP unremarkable. TSH nml  Colonoscopy April 2019 they reported colitis with biopsy showing moderate to severe chronic active colitis with ulceration.  They describe crypt distortion  associated with increased lymphocytes, plasma cells, and lymphoid aggregates within the lamina propria. severely active chronic colitis from the rectum to the transverse colon  Colonoscopy from January 21 for evaluation of ulcerative colitis showed colitis with random biopsy showing diffuse colitis with neutrophilic cryptitis, distortion of crypt architecture, moderate mononuclear cell expansion of the lamina propria and basal plasmacytosis consistent with moderately active ulcerative colitis.  No dysplasia present.    ASSESSMENT AND PLAN: Ulcerative colitis Latent TB Patient was restarted on Entyvio but unfortunately has still not been able to achieve clinical remission on this therapy. He attempted to wean off of budesonide therapy but developed recurrent symptoms. Thus will plan to switch from [facility] I discussed the risks and benefits of Remicade, Rinvoq/Xeljanz, and/or Zeposia with the patient. He is potentially interested in an oral treatment option. Thus I will plan to start the patient on Rinvoq. I went over the risks and benefits of starting Rinvoq with the patient. Patient stated that he does not have any underlying heart problems. Will obtain baseline labs today and then keep him on budesonide for now until we are able to get Rinvoq started. Patient was found to have a positive quantiferon gold and had a negative CXR subsequently. However, because he is going to start a new immunosuppressant, will refer to ID to consider whether treatment for latent TB is necessary. Patient states that he is up-to-date with all of his vaccinations (which presumably should have been given before he was started on Humira). - Check CBC, CMP, CRP, hepatitis A antibody and hepatitis B surface antibody, Entyvio level and antibody. Labs show that he has very mild hypokalemia. He will need hepatitis B vaccination in the future since his Hep B surface antibody was negative. - Recommended Shingrix vaccine for patient  prior to starting Rinvoq - Referral to ID for consideration of treatment for latent TB - Will go ahead and initiate PA process for Rinvoq - Continue budesonide therapy  Christia Reading, MD  I spent 51 minutes of time, including in depth chart review, independent review of results as outlined above, communicating results with the patient directly, face-to-face time with the patient, coordinating care, ordering studies and medications as appropriate, and documentation.

## 2022-04-16 NOTE — Telephone Encounter (Signed)
Pt seen in the office today. Advised Dr. Lorenso Courier that he does not believe Zachary [name] is helping to treat his UC symptoms. Pt discussed changing treatment to Rinvoq. Pt is aware he has latent TB and starting Rinvoq may activate TB symptoms. Therefore, URGENT referral placed with ID for pt to establish care and begin treatment prior to beginning Rinvoq. Reminder created to ensure pt is scheduled in a timely manner. Also routing this message to ID RN to ensure pt is scheduled urgently. Pt UC sx's currently being managed with use of Budesonide. Routing this message to [facility] for them to cancel all Entyvio infusion appts as scheduled per pt request. Per Dr. Lorenso Courier, will proceed with ordering Rinvoq so PA process can begin. Routing this message to GI pharmacy group to begin PA process.  ?

## 2022-04-16 NOTE — Telephone Encounter (Signed)
Zachary [name], ?Noted. ?Any up coming appt will be cancelled. ?ty

## 2022-04-17 ENCOUNTER — Other Ambulatory Visit (HOSPITAL_COMMUNITY): Payer: Self-pay

## 2022-04-17 ENCOUNTER — Other Ambulatory Visit: Payer: Self-pay

## 2022-04-17 LAB — HEPATITIS A ANTIBODY, TOTAL: Hepatitis A AB,Total: REACTIVE — AB

## 2022-04-17 LAB — HEPATITIS B SURFACE ANTIBODY,QUALITATIVE: Hep B S Ab: NONREACTIVE

## 2022-04-17 MED ORDER — ZOSTER VAC RECOMB ADJUVANTED 50 MCG/0.5ML IM SUSR
INTRAMUSCULAR | 1 refills | Status: DC
  Filled 2022-04-17: qty 0.5, 1d supply, fill #0

## 2022-04-17 NOTE — Telephone Encounter (Signed)
MAR updated to reflect accuracy of Budesonide as 4m tablets taken daily is too costly.

## 2022-04-18 ENCOUNTER — Encounter: Payer: Self-pay | Admitting: Internal Medicine

## 2022-04-18 ENCOUNTER — Other Ambulatory Visit (HOSPITAL_COMMUNITY): Payer: Self-pay

## 2022-04-18 ENCOUNTER — Ambulatory Visit: Payer: No Typology Code available for payment source | Admitting: Internal Medicine

## 2022-04-18 ENCOUNTER — Ambulatory Visit
Admission: RE | Admit: 2022-04-18 | Discharge: 2022-04-18 | Disposition: A | Discharge status: Home / Self Care | Payer: No Typology Code available for payment source | Source: Ambulatory Visit | Attending: Internal Medicine | Admitting: Internal Medicine

## 2022-04-18 ENCOUNTER — Other Ambulatory Visit: Payer: Self-pay

## 2022-04-18 ENCOUNTER — Ambulatory Visit: Payer: No Typology Code available for payment source | Attending: Family Medicine | Admitting: Pharmacist

## 2022-04-18 DIAGNOSIS — Z227 Latent tuberculosis: Secondary | ICD-10-CM

## 2022-04-18 DIAGNOSIS — K51919 Ulcerative colitis, unspecified with unspecified complications: Secondary | ICD-10-CM

## 2022-04-18 DIAGNOSIS — Z79899 Other long term (current) drug therapy: Secondary | ICD-10-CM

## 2022-04-18 MED ORDER — RINVOQ 45 MG PO TB24
45.0000 mg | ORAL_TABLET | Freq: Every day | ORAL | 0 refills | Status: AC
  Filled 2022-04-18 – 2022-05-12 (×3): qty 56, fill #0
  Filled 2022-07-21 (×2): qty 28, 28d supply, fill #0
  Filled 2022-08-05 – 2022-08-14 (×3): qty 28, 28d supply, fill #1

## 2022-04-18 MED ORDER — ISONIAZID 300 MG PO TABS
300.0000 mg | ORAL_TABLET | Freq: Every day | ORAL | 8 refills | Status: DC
  Filled 2022-04-18: qty 30, 30d supply, fill #0
  Filled 2022-05-06: qty 30, 30d supply, fill #1
  Filled 2022-06-05: qty 30, 30d supply, fill #2
  Filled 2022-08-05: qty 30, 30d supply, fill #3
  Filled 2022-09-25: qty 30, 30d supply, fill #4
  Filled 2022-11-11 – 2022-11-12 (×2): qty 30, 30d supply, fill #5
  Filled 2023-01-08: qty 30, 30d supply, fill #6
  Filled 2023-02-01: qty 30, 30d supply, fill #7
  Filled 2023-02-12 – 2023-03-01 (×2): qty 30, 30d supply, fill #8

## 2022-04-18 MED ORDER — PYRIDOXINE HCL 50 MG PO TABS
50.0000 mg | ORAL_TABLET | Freq: Every day | ORAL | 8 refills | Status: DC
Start: 2022-04-18 — End: 2024-10-29
  Filled 2022-04-18: qty 100, 90d supply, fill #0
  Filled 2022-08-05 – 2022-11-12 (×4): qty 100, 90d supply, fill #1
  Filled 2023-01-08: qty 100, 90d supply, fill #2
  Filled 2023-02-12 – 2023-02-16 (×2): qty 70, 70d supply, fill #2

## 2022-04-18 MED ORDER — RINVOQ 15 MG PO TB24
15.0000 mg | ORAL_TABLET | Freq: Every day | ORAL | 2 refills | Status: DC
  Filled 2022-04-18: qty 30, fill #0
  Filled 2022-09-09: qty 30, 30d supply, fill #0
  Filled 2022-10-04: qty 30, 30d supply, fill #1
  Filled 2022-10-06 – 2022-10-29 (×2): qty 30, 30d supply, fill #2

## 2022-04-18 NOTE — Progress Notes (Signed)
  S: Patient presents today for review of their specialty medication.   Patient is considering taking Rinvoq for rheumatoid arthritis. Patient is managed by Dr. Lorenso Courier for this.   Dosing: Adult  45 mg daily x8 weeks then 15 mg once daily thereafter.   Adherence: has not started   Efficacy: has not started   Monitoring: S/sx thromboembolism: none S/sx malignancy: none S/sx of infection: pt with latent TB being seen by ID today for recommendations   Current adverse effects: none   O:     Lab Results  Component Value Date   WBC 8.9 04/16/2022   HGB 14.3 04/16/2022   HCT 42.3 04/16/2022   MCV 92.7 04/16/2022   PLT 326.0 04/16/2022      Chemistry      Component Value Date/Time   NA 139 04/16/2022 1039   K 3.4 (L) 04/16/2022 1039   CL 104 04/16/2022 1039   CO2 26 04/16/2022 1039   BUN 11 04/16/2022 1039   CREATININE 1.06 04/16/2022 1039      Component Value Date/Time   CALCIUM 9.4 04/16/2022 1039   ALKPHOS 49 04/16/2022 1039   AST 15 04/16/2022 1039   ALT 16 04/16/2022 1039   BILITOT 0.4 04/16/2022 1039       Lab Results  Component Value Date   CHOL 169 03/20/2022   HDL 53.60 03/20/2022   LDLCALC 76 03/20/2022   TRIG 200.0 (H) 03/20/2022   CHOLHDL 3 03/20/2022     A/P: 1. Medication review: patient currently prescribed Rinvoq for rheumatoid arthritis. Reviewed the medication with the patient, including the following: Rinvoq is a medication used to treat rheumatoid arthritis. Administer with or without food. Swallow tablet whole; do not crush, split, or chew. Possible adverse effects include increased risk of infection, GI upset, hematologic toxicity, hepatic effects, lipid abnormalities, increased risk of malignancy, thromboembolism. Avoid live vaccinations. Pt seen by [facility] today and is starting tx for latent TB under Dr. Linus Salmons. Per ID, ideal to wait ~1 month if LFTs are okay before starting Rinvoq. It is okay to proceed with concurrent INH tx. Will note this  in [place].   Benard Halsted, PharmD, [place], [place] [phone]

## 2022-04-18 NOTE — Progress Notes (Signed)
xr   [facility] for Infectious Disease      Reason for Consult: positive Valma Cava test    Referring Physician: Dr. Lorenso Courier    Patient ID: Zachary [name], male    DOB: 21-Mar-1981, 41 y.o.   MRN: [id number]  HPI:   Here for evaluation of a positive Quantieferon Gold test. He recently moved here from [place] with a known history of ulcerative colitis and had been on immunsuppressive medications and now established with [facility] for continuation of care.  Dr. Lorenso Courier is considering Rinvoq and on initial evaluation, the QG test was positive.  He does not recall a previous test in [place] but was tested as an employee in February and was positive then and was offered treatment by the [facility] but declined.  He had a CXR then that was clear.  Recent AST and ALT wnl.  Asking about the shingles vaccine.   Past Medical History:  Diagnosis Date   Hypertension    Ulcerative colitis ([place])    Ulcerative colitis ([place]) 02/12/2022    Prior to Admission medications   Medication Sig Start Date End Date Taking? Authorizing Provider  amLODipine (NORVASC) 10 MG tablet Take 1 tablet by mouth daily. 07/08/21   [provider]  budesonide (ENTOCORT EC) 3 MG 24 hr capsule Take 3 capsules (9 mg total) by mouth daily. 04/14/22 08/12/22  Zehr, Janett Billow D, PA-C  lisinopril (ZESTRIL) 40 MG tablet Take 1 & 1/2 tablets (60 mg total) by mouth daily at 12 noon. 03/20/22   Terrilyn Saver, NP  Multiple Vitamin (MULTI-VITAMIN) tablet Take 1 tablet by mouth daily.    [provider]  Semaglutide-Weight Management (WEGOVY) 0.25 MG/0.5ML SOAJ Inject 1 pen (0.25 mg) into the skin once a week. After 4 weeks, increase to 0.5 mg weekly. 04/16/22   Terrilyn Saver, NP  Semaglutide-Weight Management (WEGOVY) 0.5 MG/0.5ML SOAJ Inject 0.5 mg into the skin once a week. 04/14/22   Terrilyn Saver, NP  sertraline (ZOLOFT) 100 MG tablet Take 1 tablet (100 mg total) by mouth daily. 03/20/22   Terrilyn Saver,  NP  Upadacitinib ER (RINVOQ) 15 MG TB24 Take 15 mg by mouth daily. 06/11/22   Sharyn Creamer, MD  Upadacitinib ER (RINVOQ) 45 MG TB24 Take 45 mg by mouth daily. 04/16/22 06/11/22  Sharyn Creamer, MD  Zoster Vaccine Adjuvanted [facility]) injection Inject into the muscle now and 2nd dose 2-6 months later 04/17/22   Sharyn Creamer, MD    No Known Allergies  Social History   Tobacco Use   Smoking status: Never    Passive exposure: Past   Smokeless tobacco: Former  Scientific laboratory technician Use: Never used  Substance Use Topics   Alcohol use: Yes    Comment: occasional   Drug use: Never    Family History  Problem Relation Age of Onset   COPD Mother    Healthy Father    Ovarian cancer Paternal Aunt    Colon cancer Cousin    Colon polyps Neg Hx     Review of Systems  Constitutional: negative for fevers, chills, and fatigue Respiratory: negative for cough, sputum, hemoptysis, or wheezing Integument/breast: negative for rash All other systems reviewed and are negative    Constitutional: in no apparent distress There were no vitals filed for this visit. EYES: anicteric Respiratory: normal respiratory effort GI: soft Musculoskeletal: no edema Skin: no rash  Labs: Lab Results  Component Value Date  WBC 8.9 04/16/2022   HGB 14.3 04/16/2022   HCT 42.3 04/16/2022   MCV 92.7 04/16/2022   PLT 326.0 04/16/2022    Lab Results  Component Value Date   CREATININE 1.06 04/16/2022   BUN 11 04/16/2022   NA 139 04/16/2022   K 3.4 (L) 04/16/2022   CL 104 04/16/2022   CO2 26 04/16/2022    Lab Results  Component Value Date   ALT 16 04/16/2022   AST 15 04/16/2022   ALKPHOS 49 04/16/2022   BILITOT 0.4 04/16/2022     Assessment: latent Tb with consideration of Rinvoq.  I discussed latent Tb, treatment options, risk of developing Tb and risk reduction with treatment.  In review of his history, INH is really his only option due to drug-drug interactions which is typically for 9 months.    Recent LFTs wnl.   Plan: 1)  CXR to assure no active disease 2) HIV with next lab draw 3) start INH 300 mg daily + B6 50 mcg daily Rtc in 4 weeks to repeat LFTs  From my perspective, it is ideal to wait a month or so if his LFTs are ok before starting Rinvoq but ok after that to start with concurrent INH treatment, as indicated by Dr. Lorenso Courier.

## 2022-04-19 ENCOUNTER — Other Ambulatory Visit (HOSPITAL_COMMUNITY): Payer: Self-pay

## 2022-04-21 ENCOUNTER — Other Ambulatory Visit (HOSPITAL_COMMUNITY): Payer: Self-pay

## 2022-04-21 ENCOUNTER — Encounter: Payer: Self-pay | Admitting: Internal Medicine

## 2022-04-22 ENCOUNTER — Ambulatory Visit: Payer: No Typology Code available for payment source

## 2022-04-22 NOTE — Telephone Encounter (Signed)
Received notification from [facility] regarding a prior authorization for [facility]. Authorization has been APPROVED 2 fills from 04/18/22 to 06/13/22.   Patient must fill through [place]: [phone] . Pharmacy has been notified of approval.  Authorization # (Key: Gracy Racer) - [id number]  Will initiate 22m tablet PA after patient fills 2nd month supply at pharmacy.

## 2022-04-22 NOTE — Telephone Encounter (Signed)
Pharmacy has been made aware.

## 2022-04-22 NOTE — Telephone Encounter (Signed)
Rinvoq PA has been APPROVED. Please see telephone encounter from 5.17.23

## 2022-04-22 NOTE — Telephone Encounter (Signed)
Called and spoke to the patient regarding his concerns about BM frequency at this time.  He has had to rush to the bathroom several times in order to have a BM, and these episodes can occur sporadically.  Continues to see some rectal bleeding.  He has also been experiencing some nausea symptoms, potentially due to the isoniazid therapy.  I offered him some Zofran, which he declined.  He has still been taking his budesonide 9 mg daily but still feels like this does not keep his symptoms under control fully.  He has had similar responses to budesonide as well as prednisone in the past. I did ask if he would be interested in switching to prednisone, which he declined. The budesonide should hopefully be able to keep his symptoms under control while he is on the isoniazid therapy for latent TB.  Patient was started on isoniazid on 5/20.  I would be okay with him starting Rinvoq on 6/20.  It appears as Rinvoq has been approved by his insurance.  I also think it would be reasonable at this time to go ahead and rule out an alternative source of infection so we will plan to get some stool tests.  Ammie, we have been trying to get him a shingles vaccine.  Please print out a prescription for him to be able to take to the [facility] in order to get the Shingrix vaccine.  Also please go ahead and order GI profile, C. difficile, ova and parasites, and fecal calprotectin for him to drop off a stool sample. Thanks.

## 2022-04-23 ENCOUNTER — Other Ambulatory Visit (HOSPITAL_COMMUNITY): Payer: Self-pay

## 2022-04-23 ENCOUNTER — Other Ambulatory Visit: Payer: Self-pay

## 2022-04-23 DIAGNOSIS — K5190 Ulcerative colitis, unspecified, without complications: Secondary | ICD-10-CM

## 2022-04-23 DIAGNOSIS — R197 Diarrhea, unspecified: Secondary | ICD-10-CM

## 2022-04-23 DIAGNOSIS — Z23 Encounter for immunization: Secondary | ICD-10-CM

## 2022-04-23 NOTE — Telephone Encounter (Signed)
Orders placed as requested. Reminder created to ensure pt has completed orders in a timely manner.

## 2022-04-23 NOTE — Telephone Encounter (Signed)
Shared Rinvoq start date with pharmacy. Thanks!

## 2022-04-24 LAB — SERIAL MONITORING

## 2022-04-25 ENCOUNTER — Other Ambulatory Visit: Payer: Self-pay

## 2022-04-25 ENCOUNTER — Telehealth: Payer: Self-pay | Admitting: Pharmacy Technician

## 2022-04-25 ENCOUNTER — Encounter: Payer: Self-pay | Admitting: Internal Medicine

## 2022-04-25 ENCOUNTER — Other Ambulatory Visit (HOSPITAL_COMMUNITY): Payer: Self-pay

## 2022-04-25 ENCOUNTER — Ambulatory Visit (INDEPENDENT_AMBULATORY_CARE_PROVIDER_SITE_OTHER): Payer: No Typology Code available for payment source | Admitting: Internal Medicine

## 2022-04-25 DIAGNOSIS — Z23 Encounter for immunization: Secondary | ICD-10-CM | POA: Diagnosis not present

## 2022-04-25 LAB — VEDOLIZUMAB AND ANTI-VEDO AB
Anti-Vedolizumab Antibody: <25 ng/mL
Vedolizumab: 20 ug/mL

## 2022-04-25 NOTE — Progress Notes (Signed)
Pt presents today for nurse visit for an injection. Per Dr. Lorenso Courier, injection of Heplisav given IM in L deltoid today by A. Tiasia Weberg, LPN. Denies any adverse reactions or discomfort.

## 2022-04-25 NOTE — Progress Notes (Signed)
error 

## 2022-04-25 NOTE — Telephone Encounter (Signed)
Patient Advocate Encounter  Received notification from [place] that prior authorization for [facility] VACCINE is required.   PA submitted on 5.26.23 EXPEDITED  Key [id number] Status is pending   [facility] will continue to follow  Luciano Cutter, CPhT Patient Advocate Phone: [phone]

## 2022-04-25 NOTE — Patient Instructions (Signed)
Hepatitis B Vaccine, Recombinant injection What is this medication? HEPATITIS B VACCINE (hep uh TAHY tis  B  VAK seen) is a vaccine. It is used to prevent an infection with the hepatitis B virus. This medicine may be used for other purposes; ask your health care provider or pharmacist if you have questions. COMMON BRAND NAME(S): Engerix-B, Engerix-B Pediatric, HEPLISAV-B, PreHevbrio, Recombivax HB, Recombivax HB Pediatric/Adolescent What should I tell my care team before I take this medication? They need to know if you have any of these conditions: fever, infection heart disease hepatitis B infection immune system problems kidney disease an unusual or allergic reaction to vaccines, yeast, other medicines, foods, dyes, or preservatives pregnant or trying to get pregnant breast-feeding How should I use this medication? This vaccine is for injection into a muscle. It is given by a health care professional. A copy of Vaccine Information Statements will be given before each vaccination. Read this sheet carefully each time. The sheet may change frequently. Talk to your pediatrician regarding the use of this medicine in children. While this drug may be prescribed for children as young as newborn for selected conditions, precautions do apply. Overdosage: If you think you have taken too much of this medicine contact a poison control center or emergency room at once. NOTE: This medicine is only for you. Do not share this medicine with others. What if I miss a dose? It is important not to miss your dose. Call your doctor or health care professional if you are unable to keep an appointment. What may interact with this medication? medicines that suppress your immune function like adalimumab, anakinra, infliximab medicines to treat cancer steroid medicines like prednisone or cortisone This list may not describe all possible interactions. Give your health care provider a list of all the medicines, herbs,  non-prescription drugs, or dietary supplements you use. Also tell them if you smoke, drink alcohol, or use illegal drugs. Some items may interact with your medicine. What should I watch for while using this medication? See your health care provider for all shots of this vaccine as directed. You must have 3 shots of this vaccine for protection from hepatitis B infection. Tell your doctor right away if you have any serious or unusual side effects after getting this vaccine. What side effects may I notice from receiving this medication? Side effects that you should report to your doctor or health care professional as soon as possible: allergic reactions like skin rash, itching or hives, swelling of the face, lips, or tongue breathing problems confused, irritated fast, irregular heartbeat flu-like syndrome numb, tingling pain seizures unusually weak or tired Side effects that usually do not require medical attention (report to your doctor or health care professional if they continue or are bothersome): diarrhea fever headache loss of appetite muscle pain nausea pain, redness, swelling, or irritation at site where injected tiredness This list may not describe all possible side effects. Call your doctor for medical advice about side effects. You may report side effects to FDA at 1-800-FDA-1088. Where should I keep my medication? This drug is given in a hospital or clinic and will not be stored at home. NOTE: This sheet is a summary. It may not cover all possible information. If you have questions about this medicine, talk to your doctor, pharmacist, or health care provider.  2023 Elsevier/Gold Standard (2014-03-20 00:00:00)

## 2022-04-29 NOTE — Telephone Encounter (Signed)
Received a fax regarding Prior Authorization from [facility] for [place]. Authorization has been DENIED because your plan does not allow certain drugs or classes of drugs to be covered under your pharmacy benefit.  Although this medication is not covered under your pharmacy benefit, it may be covered under your medical benefit and available through your physician.

## 2022-04-30 NOTE — Telephone Encounter (Signed)
At the time of this entry, pt still has not submitted stool sample for processing. My Chart message sent to provide pt with another reminder.

## 2022-05-01 NOTE — Telephone Encounter (Signed)
FINAL ATTEMPT:  Review of pt chart indicates pt read My Chart message as below:  Apr 30, 2022 Zachary [name] "Gerald Stabs" to Me   CR    6:14 PM Hi, Yeah, its on my list of things to do.   Thank you, Chrisp

## 2022-05-05 ENCOUNTER — Other Ambulatory Visit (HOSPITAL_COMMUNITY): Payer: Self-pay

## 2022-05-06 ENCOUNTER — Other Ambulatory Visit (HOSPITAL_COMMUNITY): Payer: Self-pay

## 2022-05-06 ENCOUNTER — Other Ambulatory Visit: Payer: No Typology Code available for payment source

## 2022-05-06 DIAGNOSIS — K5190 Ulcerative colitis, unspecified, without complications: Secondary | ICD-10-CM

## 2022-05-06 DIAGNOSIS — R197 Diarrhea, unspecified: Secondary | ICD-10-CM

## 2022-05-08 ENCOUNTER — Other Ambulatory Visit: Payer: Self-pay

## 2022-05-08 ENCOUNTER — Other Ambulatory Visit (HOSPITAL_COMMUNITY): Payer: Self-pay

## 2022-05-08 MED ORDER — ZOSTER VAC RECOMB ADJUVANTED 50 MCG/0.5ML IM SUSR
INTRAMUSCULAR | 1 refills | Status: DC
  Filled 2022-05-08 – 2022-05-09 (×2): qty 1, 1d supply, fill #0

## 2022-05-08 NOTE — Telephone Encounter (Signed)
Resubmitted Rx to [facility] with new information added to SIG as required. "needs vaccine before initiating therapy with Rinvoq"

## 2022-05-09 ENCOUNTER — Other Ambulatory Visit (HOSPITAL_COMMUNITY): Payer: Self-pay

## 2022-05-09 ENCOUNTER — Other Ambulatory Visit: Payer: Self-pay

## 2022-05-09 MED ORDER — ZOSTER VAC RECOMB ADJUVANTED 50 MCG/0.5ML IM SUSR
INTRAMUSCULAR | 1 refills | Status: DC
  Filled 2022-05-09: qty 1, 1d supply, fill #0
  Filled 2022-06-17 – 2022-07-21 (×2): qty 1, 1d supply, fill #1

## 2022-05-09 NOTE — Telephone Encounter (Signed)
Please include the diagnosis on the script as well. Once that is updated on the script, I will follow up with our Director of Pharmacy, so that she can add the overrides. As stated, diagnosis and reason for use must be documented on the script for them to enter an override. I will follow up with Aaron Edelman at [facility] so he is aware as well of what the need is.

## 2022-05-09 NOTE — Telephone Encounter (Signed)
Done

## 2022-05-09 NOTE — Telephone Encounter (Signed)
[place] has the script now what they need on the script. Talked with them this morning. They tried to run it this morning, but it is still being denied. Will you be reaching out to them this afternoon? I am available if there is anything you need me to do. Thank you

## 2022-05-10 ENCOUNTER — Other Ambulatory Visit (HOSPITAL_COMMUNITY): Payer: Self-pay

## 2022-05-12 ENCOUNTER — Other Ambulatory Visit (HOSPITAL_COMMUNITY): Payer: Self-pay

## 2022-05-12 LAB — OVA AND PARASITE EXAMINATION
CONCENTRATE RESULT:: NONE SEEN
MICRO NUMBER:: 13489207
SPECIMEN QUALITY:: ADEQUATE
TRICHROME RESULT:: NONE SEEN

## 2022-05-13 ENCOUNTER — Other Ambulatory Visit (HOSPITAL_COMMUNITY): Payer: Self-pay

## 2022-05-13 ENCOUNTER — Other Ambulatory Visit (HOSPITAL_BASED_OUTPATIENT_CLINIC_OR_DEPARTMENT_OTHER): Payer: Self-pay

## 2022-05-16 ENCOUNTER — Encounter: Payer: Self-pay | Admitting: Internal Medicine

## 2022-05-19 ENCOUNTER — Other Ambulatory Visit (HOSPITAL_COMMUNITY): Payer: Self-pay

## 2022-05-23 ENCOUNTER — Ambulatory Visit: Payer: No Typology Code available for payment source | Admitting: Internal Medicine

## 2022-05-26 ENCOUNTER — Other Ambulatory Visit (HOSPITAL_COMMUNITY): Payer: Self-pay

## 2022-05-28 ENCOUNTER — Encounter: Payer: Self-pay | Admitting: Internal Medicine

## 2022-05-28 ENCOUNTER — Ambulatory Visit: Payer: No Typology Code available for payment source | Admitting: Internal Medicine

## 2022-06-05 ENCOUNTER — Other Ambulatory Visit (HOSPITAL_COMMUNITY): Payer: Self-pay

## 2022-06-05 ENCOUNTER — Ambulatory Visit (INDEPENDENT_AMBULATORY_CARE_PROVIDER_SITE_OTHER): Payer: Self-pay | Admitting: Internal Medicine

## 2022-06-05 DIAGNOSIS — Z23 Encounter for immunization: Secondary | ICD-10-CM

## 2022-06-06 ENCOUNTER — Other Ambulatory Visit (HOSPITAL_COMMUNITY): Payer: Self-pay

## 2022-06-09 ENCOUNTER — Encounter: Payer: Self-pay | Admitting: Internal Medicine

## 2022-06-11 ENCOUNTER — Ambulatory Visit: Payer: Self-pay | Admitting: Internal Medicine

## 2022-06-12 ENCOUNTER — Other Ambulatory Visit (HOSPITAL_COMMUNITY): Payer: Self-pay

## 2022-06-17 ENCOUNTER — Other Ambulatory Visit (HOSPITAL_BASED_OUTPATIENT_CLINIC_OR_DEPARTMENT_OTHER): Payer: Self-pay

## 2022-06-17 ENCOUNTER — Other Ambulatory Visit (HOSPITAL_COMMUNITY): Payer: Self-pay

## 2022-06-17 ENCOUNTER — Other Ambulatory Visit: Payer: Self-pay | Admitting: Family Medicine

## 2022-06-17 MED ORDER — WEGOVY 0.5 MG/0.5ML ~~LOC~~ SOAJ
0.5000 mg | SUBCUTANEOUS | 0 refills | Status: DC
  Filled 2022-06-17: qty 2, 28d supply, fill #0

## 2022-06-23 ENCOUNTER — Encounter: Payer: Self-pay | Admitting: Internal Medicine

## 2022-06-27 ENCOUNTER — Ambulatory Visit: Payer: Self-pay | Admitting: Internal Medicine

## 2022-07-02 ENCOUNTER — Ambulatory Visit: Payer: Self-pay | Admitting: Internal Medicine

## 2022-07-02 ENCOUNTER — Encounter: Payer: Self-pay | Admitting: Internal Medicine

## 2022-07-04 ENCOUNTER — Ambulatory Visit: Payer: Self-pay | Admitting: Internal Medicine

## 2022-07-07 ENCOUNTER — Other Ambulatory Visit: Payer: Self-pay | Admitting: Family Medicine

## 2022-07-07 ENCOUNTER — Other Ambulatory Visit (HOSPITAL_COMMUNITY): Payer: Self-pay

## 2022-07-07 DIAGNOSIS — F419 Anxiety disorder, unspecified: Secondary | ICD-10-CM

## 2022-07-07 MED ORDER — SERTRALINE HCL 100 MG PO TABS
100.0000 mg | ORAL_TABLET | Freq: Every day | ORAL | 0 refills | Status: DC
  Filled 2022-07-07 – 2022-08-05 (×2): qty 30, 30d supply, fill #0

## 2022-07-11 ENCOUNTER — Other Ambulatory Visit (HOSPITAL_COMMUNITY): Payer: Self-pay

## 2022-07-14 ENCOUNTER — Other Ambulatory Visit (HOSPITAL_COMMUNITY): Payer: Self-pay

## 2022-07-15 ENCOUNTER — Other Ambulatory Visit (HOSPITAL_COMMUNITY): Payer: Self-pay

## 2022-07-15 ENCOUNTER — Other Ambulatory Visit (HOSPITAL_BASED_OUTPATIENT_CLINIC_OR_DEPARTMENT_OTHER): Payer: Self-pay

## 2022-07-15 ENCOUNTER — Other Ambulatory Visit: Payer: Self-pay | Admitting: Family Medicine

## 2022-07-15 MED ORDER — WEGOVY 0.5 MG/0.5ML ~~LOC~~ SOAJ
0.5000 mg | SUBCUTANEOUS | 0 refills | Status: DC
  Filled 2022-07-15 (×3): qty 2, 28d supply, fill #0

## 2022-07-18 ENCOUNTER — Ambulatory Visit: Payer: Self-pay | Admitting: Internal Medicine

## 2022-07-21 ENCOUNTER — Other Ambulatory Visit (HOSPITAL_COMMUNITY): Payer: Self-pay

## 2022-07-22 ENCOUNTER — Other Ambulatory Visit (HOSPITAL_COMMUNITY): Payer: Self-pay

## 2022-07-22 ENCOUNTER — Ambulatory Visit: Payer: No Typology Code available for payment source | Admitting: Internal Medicine

## 2022-07-22 ENCOUNTER — Other Ambulatory Visit (HOSPITAL_BASED_OUTPATIENT_CLINIC_OR_DEPARTMENT_OTHER): Payer: Self-pay

## 2022-07-23 ENCOUNTER — Other Ambulatory Visit (HOSPITAL_COMMUNITY): Payer: Self-pay

## 2022-07-24 ENCOUNTER — Other Ambulatory Visit (HOSPITAL_COMMUNITY): Payer: Self-pay

## 2022-07-29 ENCOUNTER — Ambulatory Visit: Payer: No Typology Code available for payment source | Admitting: Internal Medicine

## 2022-07-31 ENCOUNTER — Other Ambulatory Visit (HOSPITAL_BASED_OUTPATIENT_CLINIC_OR_DEPARTMENT_OTHER): Payer: Self-pay

## 2022-08-01 ENCOUNTER — Other Ambulatory Visit (HOSPITAL_BASED_OUTPATIENT_CLINIC_OR_DEPARTMENT_OTHER): Payer: Self-pay

## 2022-08-05 ENCOUNTER — Other Ambulatory Visit (HOSPITAL_COMMUNITY): Payer: Self-pay

## 2022-08-05 ENCOUNTER — Other Ambulatory Visit: Payer: Self-pay | Admitting: Gastroenterology

## 2022-08-05 MED ORDER — BUDESONIDE 3 MG PO CPEP
9.0000 mg | ORAL_CAPSULE | Freq: Every day | ORAL | 3 refills | Status: AC
  Filled 2022-08-05: qty 90, 30d supply, fill #0
  Filled 2022-09-12: qty 90, 30d supply, fill #1
  Filled 2022-10-12: qty 90, 30d supply, fill #2
  Filled 2022-11-11 – 2022-11-12 (×2): qty 90, 30d supply, fill #3

## 2022-08-06 ENCOUNTER — Other Ambulatory Visit (HOSPITAL_COMMUNITY): Payer: Self-pay

## 2022-08-07 ENCOUNTER — Other Ambulatory Visit (HOSPITAL_COMMUNITY): Payer: Self-pay

## 2022-08-11 ENCOUNTER — Other Ambulatory Visit (HOSPITAL_BASED_OUTPATIENT_CLINIC_OR_DEPARTMENT_OTHER): Payer: Self-pay

## 2022-08-13 ENCOUNTER — Other Ambulatory Visit (HOSPITAL_COMMUNITY): Payer: Self-pay

## 2022-08-14 ENCOUNTER — Other Ambulatory Visit (HOSPITAL_COMMUNITY): Payer: Self-pay

## 2022-08-16 ENCOUNTER — Other Ambulatory Visit (HOSPITAL_COMMUNITY): Payer: Self-pay

## 2022-08-18 ENCOUNTER — Other Ambulatory Visit (HOSPITAL_COMMUNITY): Payer: Self-pay

## 2022-08-20 ENCOUNTER — Other Ambulatory Visit (HOSPITAL_COMMUNITY): Payer: Self-pay

## 2022-08-21 ENCOUNTER — Other Ambulatory Visit (HOSPITAL_BASED_OUTPATIENT_CLINIC_OR_DEPARTMENT_OTHER): Payer: Self-pay

## 2022-08-27 ENCOUNTER — Other Ambulatory Visit (HOSPITAL_BASED_OUTPATIENT_CLINIC_OR_DEPARTMENT_OTHER): Payer: Self-pay

## 2022-08-28 ENCOUNTER — Encounter: Payer: Self-pay | Admitting: Family Medicine

## 2022-08-29 ENCOUNTER — Other Ambulatory Visit (HOSPITAL_BASED_OUTPATIENT_CLINIC_OR_DEPARTMENT_OTHER): Payer: Self-pay

## 2022-08-29 ENCOUNTER — Other Ambulatory Visit: Payer: Self-pay | Admitting: Family

## 2022-08-29 ENCOUNTER — Other Ambulatory Visit (HOSPITAL_COMMUNITY): Payer: Self-pay

## 2022-08-29 DIAGNOSIS — F419 Anxiety disorder, unspecified: Secondary | ICD-10-CM

## 2022-08-29 MED ORDER — SERTRALINE HCL 100 MG PO TABS
200.0000 mg | ORAL_TABLET | Freq: Every day | ORAL | 0 refills | Status: DC
  Filled 2022-08-29 (×2): qty 180, 90d supply, fill #0

## 2022-08-30 IMAGING — CR DG CHEST 2V
2 series · 2 of 2 positions shown · non-contrast
Comparison: 01/17/2022

CLINICAL DATA: Latent TB.

EXAM:
CHEST - 2 VIEW

[w chest pa]
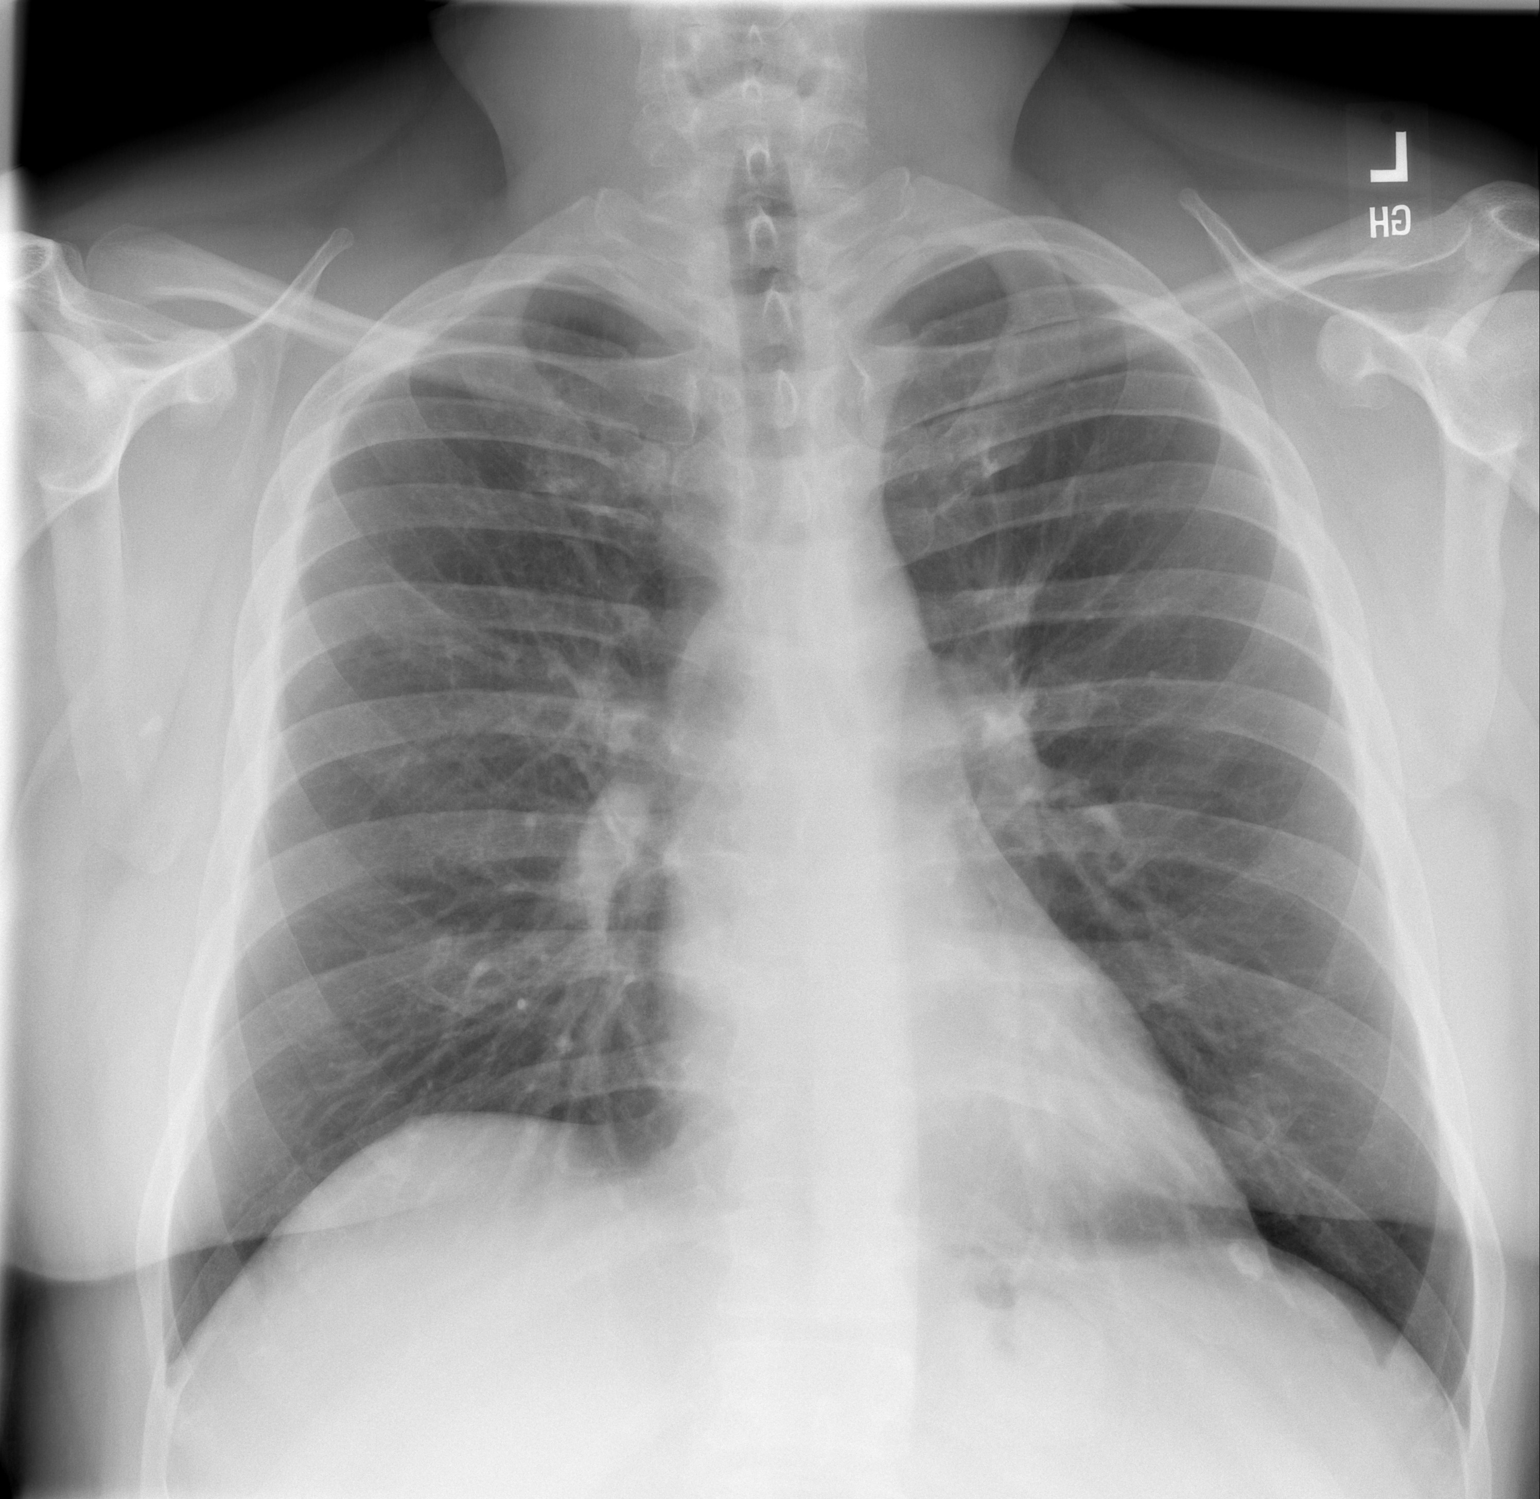

[w chest lat]
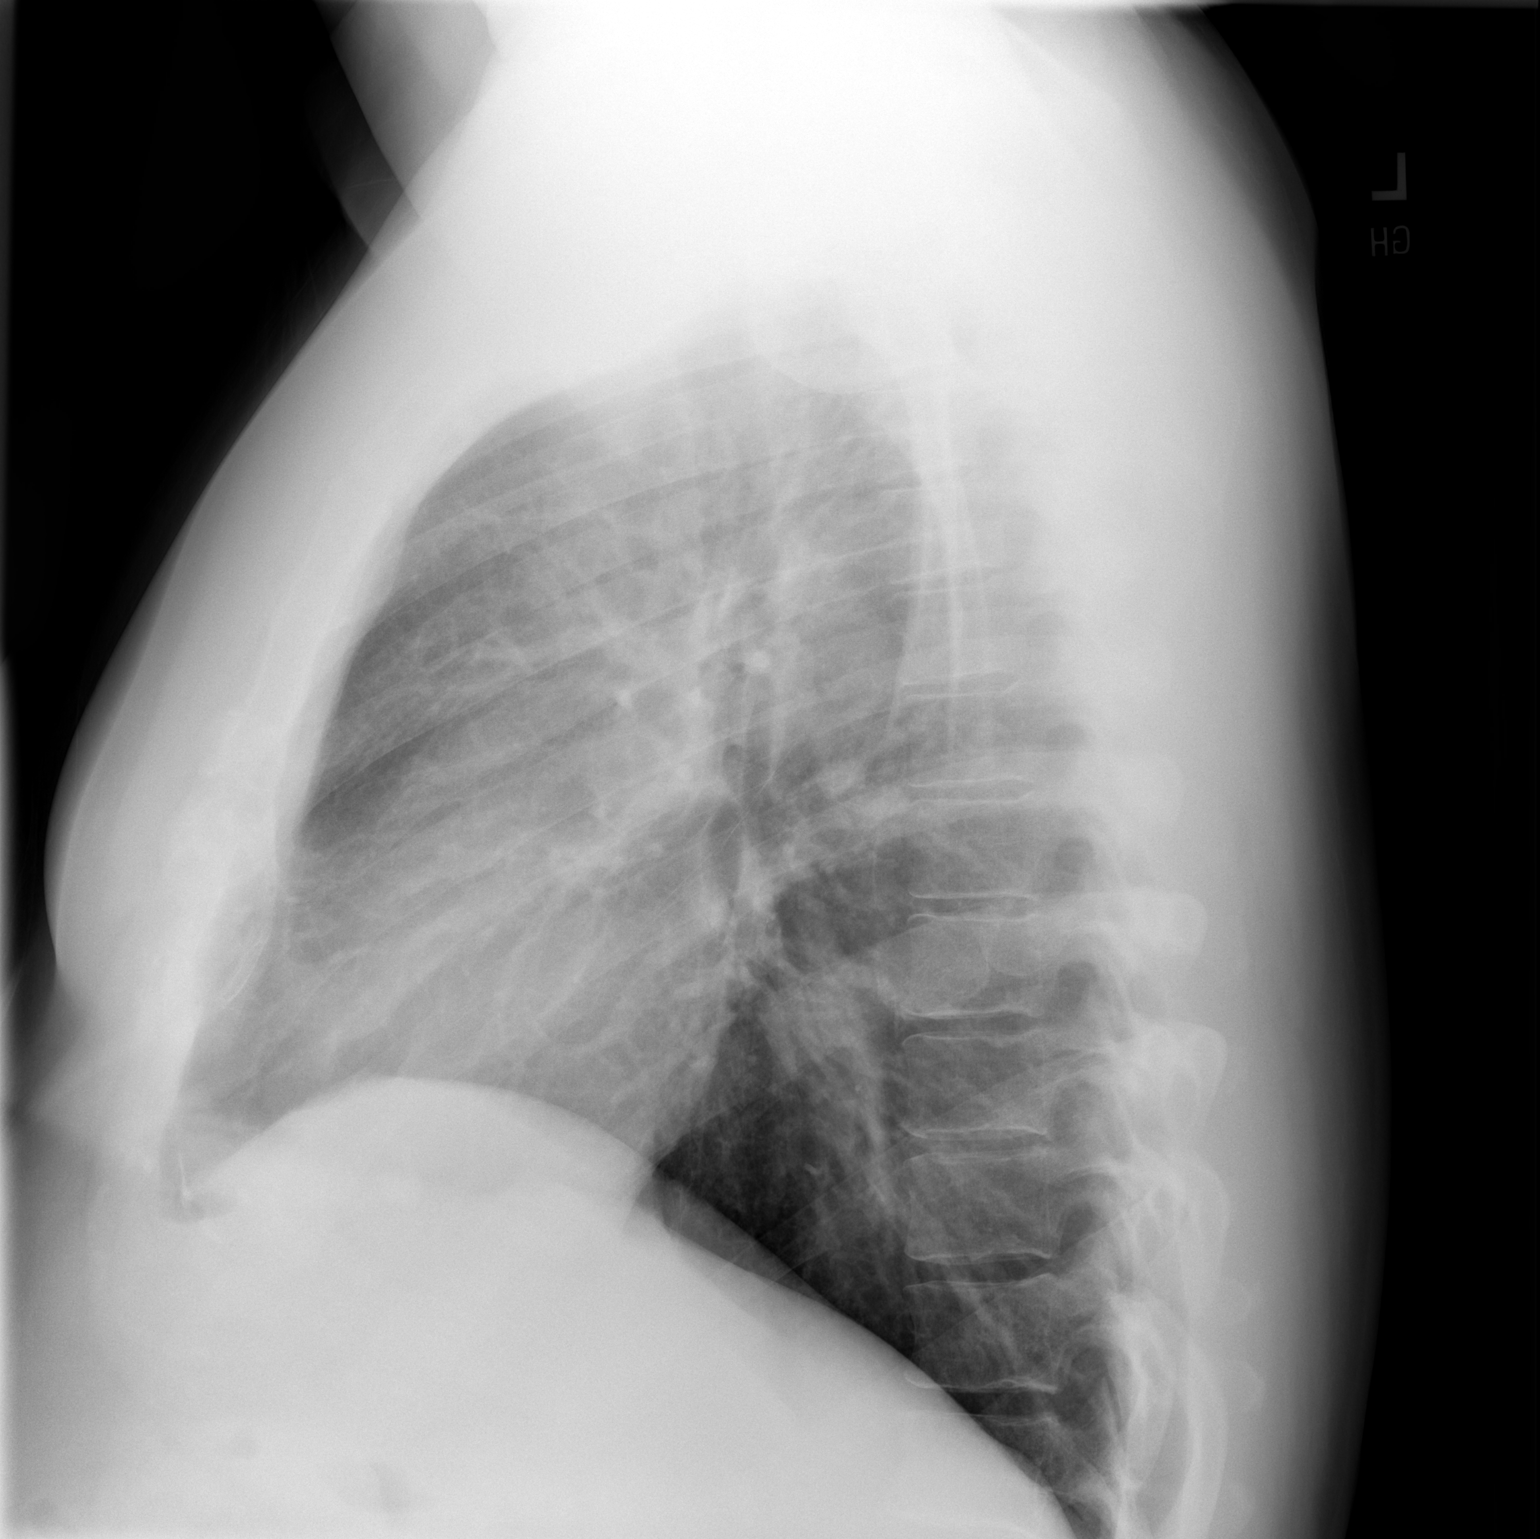

[2 of 2 positions shown; findings below may reference images not displayed]

FINDINGS: The lungs are clear without focal pneumonia, edema, pneumothorax or
pleural effusion. The cardiopericardial silhouette is within normal
limits for size. The visualized bony structures of the thorax are
unremarkable.
IMPRESSION: No active cardiopulmonary disease.

## 2022-09-09 ENCOUNTER — Other Ambulatory Visit (HOSPITAL_COMMUNITY): Payer: Self-pay

## 2022-09-15 ENCOUNTER — Other Ambulatory Visit (HOSPITAL_COMMUNITY): Payer: Self-pay

## 2022-09-15 ENCOUNTER — Other Ambulatory Visit (HOSPITAL_BASED_OUTPATIENT_CLINIC_OR_DEPARTMENT_OTHER): Payer: Self-pay

## 2022-09-16 ENCOUNTER — Other Ambulatory Visit (HOSPITAL_BASED_OUTPATIENT_CLINIC_OR_DEPARTMENT_OTHER): Payer: Self-pay

## 2022-09-25 ENCOUNTER — Other Ambulatory Visit: Payer: Self-pay | Admitting: Family Medicine

## 2022-09-25 ENCOUNTER — Other Ambulatory Visit (HOSPITAL_COMMUNITY): Payer: Self-pay

## 2022-09-25 ENCOUNTER — Other Ambulatory Visit (HOSPITAL_BASED_OUTPATIENT_CLINIC_OR_DEPARTMENT_OTHER): Payer: Self-pay

## 2022-09-25 MED ORDER — AMLODIPINE BESYLATE 10 MG PO TABS
10.0000 mg | ORAL_TABLET | Freq: Every day | ORAL | 0 refills | Status: DC
  Filled 2022-09-25: qty 30, 30d supply, fill #0

## 2022-09-26 ENCOUNTER — Other Ambulatory Visit (HOSPITAL_BASED_OUTPATIENT_CLINIC_OR_DEPARTMENT_OTHER): Payer: Self-pay

## 2022-10-01 ENCOUNTER — Other Ambulatory Visit (HOSPITAL_BASED_OUTPATIENT_CLINIC_OR_DEPARTMENT_OTHER): Payer: Self-pay

## 2022-10-06 ENCOUNTER — Other Ambulatory Visit (HOSPITAL_COMMUNITY): Payer: Self-pay

## 2022-10-09 ENCOUNTER — Other Ambulatory Visit (HOSPITAL_COMMUNITY): Payer: Self-pay

## 2022-10-10 ENCOUNTER — Other Ambulatory Visit (HOSPITAL_COMMUNITY): Payer: Self-pay

## 2022-10-12 ENCOUNTER — Other Ambulatory Visit: Payer: Self-pay | Admitting: Family

## 2022-10-12 ENCOUNTER — Other Ambulatory Visit: Payer: Self-pay | Admitting: Family Medicine

## 2022-10-12 DIAGNOSIS — I10 Essential (primary) hypertension: Secondary | ICD-10-CM

## 2022-10-12 DIAGNOSIS — F419 Anxiety disorder, unspecified: Secondary | ICD-10-CM

## 2022-10-13 ENCOUNTER — Other Ambulatory Visit (HOSPITAL_COMMUNITY): Payer: Self-pay

## 2022-10-13 MED ORDER — AMLODIPINE BESYLATE 10 MG PO TABS
10.0000 mg | ORAL_TABLET | Freq: Every day | ORAL | 0 refills | Status: DC
  Filled 2022-10-13 – 2022-10-19 (×2): qty 30, 30d supply, fill #0

## 2022-10-13 MED ORDER — LISINOPRIL 40 MG PO TABS
60.0000 mg | ORAL_TABLET | Freq: Every day | ORAL | 0 refills | Status: DC
  Filled 2022-10-13: qty 45, 30d supply, fill #0

## 2022-10-19 ENCOUNTER — Other Ambulatory Visit: Payer: Self-pay | Admitting: Family Medicine

## 2022-10-19 DIAGNOSIS — I10 Essential (primary) hypertension: Secondary | ICD-10-CM

## 2022-10-20 ENCOUNTER — Other Ambulatory Visit (HOSPITAL_COMMUNITY): Payer: Self-pay

## 2022-10-21 ENCOUNTER — Other Ambulatory Visit (HOSPITAL_COMMUNITY): Payer: Self-pay

## 2022-10-24 ENCOUNTER — Other Ambulatory Visit: Payer: Self-pay | Admitting: Family

## 2022-10-24 ENCOUNTER — Other Ambulatory Visit (HOSPITAL_COMMUNITY): Payer: Self-pay

## 2022-10-24 DIAGNOSIS — F32A Depression, unspecified: Secondary | ICD-10-CM

## 2022-10-29 ENCOUNTER — Other Ambulatory Visit (HOSPITAL_COMMUNITY): Payer: Self-pay

## 2022-10-31 ENCOUNTER — Other Ambulatory Visit (HOSPITAL_COMMUNITY): Payer: Self-pay

## 2022-10-31 ENCOUNTER — Encounter: Payer: Self-pay | Admitting: Family Medicine

## 2022-11-11 ENCOUNTER — Other Ambulatory Visit (HOSPITAL_COMMUNITY): Payer: Self-pay

## 2022-11-12 ENCOUNTER — Other Ambulatory Visit: Payer: Self-pay

## 2022-11-12 ENCOUNTER — Other Ambulatory Visit (HOSPITAL_COMMUNITY): Payer: Self-pay

## 2022-11-13 ENCOUNTER — Other Ambulatory Visit: Payer: Self-pay | Admitting: Family Medicine

## 2022-11-13 DIAGNOSIS — I10 Essential (primary) hypertension: Secondary | ICD-10-CM

## 2022-11-14 ENCOUNTER — Other Ambulatory Visit: Payer: Self-pay | Admitting: Family Medicine

## 2022-11-14 DIAGNOSIS — I10 Essential (primary) hypertension: Secondary | ICD-10-CM

## 2022-11-15 ENCOUNTER — Other Ambulatory Visit (HOSPITAL_COMMUNITY): Payer: Self-pay

## 2022-11-18 ENCOUNTER — Other Ambulatory Visit: Payer: Self-pay

## 2022-11-18 ENCOUNTER — Other Ambulatory Visit: Payer: Self-pay | Admitting: Internal Medicine

## 2022-11-18 DIAGNOSIS — K51919 Ulcerative colitis, unspecified with unspecified complications: Secondary | ICD-10-CM

## 2022-11-21 ENCOUNTER — Other Ambulatory Visit: Payer: Self-pay | Admitting: Pharmacist

## 2022-11-21 ENCOUNTER — Other Ambulatory Visit (HOSPITAL_COMMUNITY): Payer: Self-pay

## 2022-11-21 DIAGNOSIS — K51919 Ulcerative colitis, unspecified with unspecified complications: Secondary | ICD-10-CM

## 2022-11-21 MED ORDER — RINVOQ 15 MG PO TB24
15.0000 mg | ORAL_TABLET | Freq: Every day | ORAL | 2 refills | Status: DC
  Filled 2022-11-21 (×2): qty 30, 30d supply, fill #0
  Filled 2022-12-25: qty 30, 30d supply, fill #1
  Filled 2023-01-22: qty 30, 30d supply, fill #2
  Filled ????-??-??: fill #0

## 2022-11-21 MED ORDER — RINVOQ 15 MG PO TB24
15.0000 mg | ORAL_TABLET | Freq: Every day | ORAL | 2 refills | Status: DC
  Filled 2022-11-21: qty 30, 30d supply, fill #0

## 2022-11-25 ENCOUNTER — Other Ambulatory Visit (HOSPITAL_COMMUNITY): Payer: Self-pay

## 2022-11-26 ENCOUNTER — Other Ambulatory Visit (HOSPITAL_COMMUNITY): Payer: Self-pay

## 2022-11-27 ENCOUNTER — Other Ambulatory Visit (HOSPITAL_COMMUNITY): Payer: Self-pay

## 2022-11-28 ENCOUNTER — Other Ambulatory Visit: Payer: Self-pay | Admitting: Family Medicine

## 2022-11-28 DIAGNOSIS — I10 Essential (primary) hypertension: Secondary | ICD-10-CM

## 2022-12-02 ENCOUNTER — Other Ambulatory Visit: Payer: Self-pay

## 2022-12-02 MED ORDER — LISINOPRIL 40 MG PO TABS
60.0000 mg | ORAL_TABLET | Freq: Every day | ORAL | 0 refills | Status: DC
  Filled 2022-12-02: qty 45, 30d supply, fill #0

## 2022-12-02 MED ORDER — AMLODIPINE BESYLATE 10 MG PO TABS
10.0000 mg | ORAL_TABLET | Freq: Every day | ORAL | 0 refills | Status: DC
  Filled 2022-12-02: qty 30, 30d supply, fill #0

## 2022-12-08 ENCOUNTER — Other Ambulatory Visit (HOSPITAL_COMMUNITY): Payer: Self-pay

## 2022-12-09 ENCOUNTER — Encounter: Payer: Self-pay | Admitting: Family Medicine

## 2022-12-09 ENCOUNTER — Ambulatory Visit: Payer: 59 | Admitting: Internal Medicine

## 2022-12-10 ENCOUNTER — Ambulatory Visit: Payer: 59 | Admitting: Internal Medicine

## 2022-12-11 ENCOUNTER — Other Ambulatory Visit: Payer: Self-pay | Admitting: *Deleted

## 2022-12-11 ENCOUNTER — Other Ambulatory Visit: Payer: Self-pay

## 2022-12-11 ENCOUNTER — Other Ambulatory Visit (HOSPITAL_COMMUNITY): Payer: Self-pay

## 2022-12-11 DIAGNOSIS — F419 Anxiety disorder, unspecified: Secondary | ICD-10-CM

## 2022-12-11 MED ORDER — SERTRALINE HCL 100 MG PO TABS
200.0000 mg | ORAL_TABLET | Freq: Every day | ORAL | 0 refills | Status: DC
  Filled 2022-12-11: qty 180, 90d supply, fill #0

## 2022-12-12 ENCOUNTER — Other Ambulatory Visit (HOSPITAL_COMMUNITY): Payer: Self-pay

## 2022-12-15 ENCOUNTER — Ambulatory Visit: Payer: 59 | Admitting: Family Medicine

## 2022-12-17 ENCOUNTER — Ambulatory Visit: Payer: 59 | Admitting: Family Medicine

## 2022-12-17 ENCOUNTER — Other Ambulatory Visit (HOSPITAL_COMMUNITY): Payer: Self-pay

## 2022-12-19 ENCOUNTER — Ambulatory Visit: Payer: 59 | Admitting: Family Medicine

## 2022-12-22 ENCOUNTER — Ambulatory Visit: Payer: 59 | Admitting: Family Medicine

## 2022-12-23 ENCOUNTER — Other Ambulatory Visit (HOSPITAL_COMMUNITY): Payer: Self-pay

## 2022-12-24 ENCOUNTER — Ambulatory Visit: Payer: 59 | Admitting: Family Medicine

## 2022-12-25 ENCOUNTER — Other Ambulatory Visit: Payer: Self-pay | Admitting: Family Medicine

## 2022-12-25 ENCOUNTER — Other Ambulatory Visit (HOSPITAL_COMMUNITY): Payer: Self-pay

## 2022-12-25 DIAGNOSIS — I10 Essential (primary) hypertension: Secondary | ICD-10-CM

## 2022-12-25 DIAGNOSIS — F32A Depression, unspecified: Secondary | ICD-10-CM

## 2022-12-26 ENCOUNTER — Ambulatory Visit: Payer: 59 | Admitting: Family Medicine

## 2022-12-26 ENCOUNTER — Other Ambulatory Visit (HOSPITAL_COMMUNITY): Payer: Self-pay

## 2022-12-29 ENCOUNTER — Ambulatory Visit: Payer: 59 | Admitting: Family Medicine

## 2022-12-29 ENCOUNTER — Other Ambulatory Visit (HOSPITAL_COMMUNITY): Payer: Self-pay

## 2022-12-30 ENCOUNTER — Other Ambulatory Visit: Payer: Self-pay

## 2022-12-31 ENCOUNTER — Ambulatory Visit: Payer: 59 | Admitting: Family Medicine

## 2022-12-31 ENCOUNTER — Other Ambulatory Visit (HOSPITAL_COMMUNITY): Payer: Self-pay

## 2023-01-01 ENCOUNTER — Other Ambulatory Visit: Payer: Self-pay

## 2023-01-01 ENCOUNTER — Other Ambulatory Visit (HOSPITAL_COMMUNITY): Payer: Self-pay

## 2023-01-02 ENCOUNTER — Ambulatory Visit: Payer: 59 | Admitting: Family Medicine

## 2023-01-05 ENCOUNTER — Encounter: Payer: Self-pay | Admitting: Family Medicine

## 2023-01-05 ENCOUNTER — Other Ambulatory Visit: Payer: Self-pay

## 2023-01-05 ENCOUNTER — Ambulatory Visit (INDEPENDENT_AMBULATORY_CARE_PROVIDER_SITE_OTHER): Payer: 59 | Admitting: Family Medicine

## 2023-01-05 ENCOUNTER — Other Ambulatory Visit (HOSPITAL_COMMUNITY): Payer: Self-pay

## 2023-01-05 ENCOUNTER — Ambulatory Visit (HOSPITAL_BASED_OUTPATIENT_CLINIC_OR_DEPARTMENT_OTHER)
Admission: RE | Admit: 2023-01-05 | Discharge: 2023-01-05 | Disposition: A | Discharge status: Home / Self Care | Payer: 59 | Source: Ambulatory Visit | Attending: Family Medicine | Admitting: Family Medicine

## 2023-01-05 VITALS — BP 137/90 | HR 98 | Temp 98.2°F | Resp 98 | Ht 72.0 in | Wt 256.0 lb

## 2023-01-05 DIAGNOSIS — M25512 Pain in left shoulder: Secondary | ICD-10-CM | POA: Diagnosis not present

## 2023-01-05 DIAGNOSIS — M19012 Primary osteoarthritis, left shoulder: Secondary | ICD-10-CM | POA: Diagnosis not present

## 2023-01-05 DIAGNOSIS — R7401 Elevation of levels of liver transaminase levels: Secondary | ICD-10-CM | POA: Diagnosis not present

## 2023-01-05 DIAGNOSIS — Z23 Encounter for immunization: Secondary | ICD-10-CM | POA: Diagnosis not present

## 2023-01-05 DIAGNOSIS — I10 Essential (primary) hypertension: Secondary | ICD-10-CM

## 2023-01-05 DIAGNOSIS — R739 Hyperglycemia, unspecified: Secondary | ICD-10-CM

## 2023-01-05 DIAGNOSIS — F419 Anxiety disorder, unspecified: Secondary | ICD-10-CM

## 2023-01-05 DIAGNOSIS — F32A Depression, unspecified: Secondary | ICD-10-CM

## 2023-01-05 DIAGNOSIS — R221 Localized swelling, mass and lump, neck: Secondary | ICD-10-CM | POA: Diagnosis not present

## 2023-01-05 LAB — COMPREHENSIVE METABOLIC PANEL
ALT: 49 U/L (ref 0–53)
AST: 50 U/L — ABNORMAL HIGH (ref 0–37)
Albumin: 4.1 g/dL (ref 3.5–5.2)
Alkaline Phosphatase: 69 U/L (ref 39–117)
BUN: 9 mg/dL (ref 6–23)
CO2: 25 mEq/L (ref 19–32)
Calcium: 9 mg/dL (ref 8.4–10.5)
Chloride: 101 mEq/L (ref 96–112)
Creatinine, Ser: 0.86 mg/dL (ref 0.40–1.50)
GFR: 106.22 mL/min (ref >60.00)
Glucose, Bld: 118 mg/dL — ABNORMAL HIGH (ref 70–99)
Potassium: 3.8 mEq/L (ref 3.5–5.1)
Sodium: 138 mEq/L (ref 135–145)
Total Bilirubin: 0.5 mg/dL (ref 0.2–1.2)
Total Protein: 7.1 g/dL (ref 6.0–8.3)

## 2023-01-05 LAB — CBC WITH DIFFERENTIAL/PLATELET
Basophils Absolute: 0 10*3/uL (ref 0.0–0.1)
Basophils Relative: 0.4 % (ref 0.0–3.0)
Eosinophils Absolute: 0.1 10*3/uL (ref 0.0–0.7)
Eosinophils Relative: 1.7 % (ref 0.0–5.0)
HCT: 38.1 % — ABNORMAL LOW (ref 39.0–52.0)
Hemoglobin: 12.8 g/dL — ABNORMAL LOW (ref 13.0–17.0)
Lymphocytes Relative: 22.3 % (ref 12.0–46.0)
Lymphs Abs: 1.2 10*3/uL (ref 0.7–4.0)
MCHC: 33.7 g/dL (ref 30.0–36.0)
MCV: 98.3 fl (ref 78.0–100.0)
Monocytes Absolute: 0.8 10*3/uL (ref 0.1–1.0)
Monocytes Relative: 14.3 % — ABNORMAL HIGH (ref 3.0–12.0)
Neutro Abs: 3.2 10*3/uL (ref 1.4–7.7)
Neutrophils Relative %: 61.3 % (ref 43.0–77.0)
Platelets: 351 10*3/uL (ref 150.0–400.0)
RBC: 3.88 Mil/uL — ABNORMAL LOW (ref 4.22–5.81)
RDW: 15.3 % (ref 11.5–15.5)
WBC: 5.3 10*3/uL (ref 4.0–10.5)

## 2023-01-05 LAB — LDL CHOLESTEROL, DIRECT: Direct LDL: 103 mg/dL

## 2023-01-05 LAB — LIPID PANEL
Cholesterol: 233 mg/dL — ABNORMAL HIGH (ref 0–200)
HDL: 77.4 mg/dL (ref >39.00)
NonHDL: 156.07
Total CHOL/HDL Ratio: 3
Triglycerides: 341 mg/dL — ABNORMAL HIGH (ref 0.0–149.0)
VLDL: 68.2 mg/dL — ABNORMAL HIGH (ref 0.0–40.0)

## 2023-01-05 LAB — HEMOGLOBIN A1C: Hgb A1c MFr Bld: 5.9 % (ref 4.6–6.5)

## 2023-01-05 MED ORDER — AMLODIPINE BESYLATE 10 MG PO TABS
10.0000 mg | ORAL_TABLET | Freq: Every day | ORAL | 0 refills | Status: DC
  Filled 2023-01-05: qty 30, 30d supply, fill #0

## 2023-01-05 NOTE — Progress Notes (Signed)
Established Patient Office Visit  Subjective   Patient ID: Zachary [name], male    DOB: 10-15-1981  Age: 42 y.o. MRN: [id number]  CC: routine f/u, shoulder pain    HPI  Patient is here for routine follow-up.   Hypertension: - Medications: amlodipine 10 mg daily, lisinopril 40 mg daily - Compliance: good, ran out of amlodipine a few days ago - Checking BP at home: no - Denies any SOB, recurrent headaches, CP, vision changes, LE edema, dizziness, palpitations, or medication side effects. - Diet: trying to heart healthy, meal prepping - Exercise: going to [employer] 3-4 days per week   Mood follow-up: - Diagnosis: anxiety and depression - Treatment: sertraline 100 mg daily - Medication side effects: none - SI/HI: none - Update: Doing well, no new concerns.   Left shoulder pain: - Patient reports that his left shoulder has been bothering him for the past 2 months or so. He does not recall any specific injury. He has been trying to exercise and lift weights. Reports the pain is deep in the middle of shoulder and can get up to 7/10 at times, then other times pain is not very noticeable. Sometimes he has trouble getting comfortable to sleep at night. He has not noticed any skin changes, swelling, weakness, numbness, tingling. He will hear occasional popping/clicking. Pain seems to be worse with overhead motion.    Right posterior neck nodule: - Patient reports he has noticed a small, pea-size, firm nodule to his right posterior neck near hairline for the past several years. It has not changed in size at all. Reports he has been able to "pop" it in the past, but not recently. He has not felt any other nodules anywhere else in the head/neck region. He does not have pain in the area. No unexplained weight loss. No warmth, erythema, or edema to the area.         ROS All review of systems negative except what is listed in the HPI    Objective:     BP (!) 137/90   Pulse 98    Temp 98.2 F (36.8 C)   Resp (!) 98   Ht 6' (1.829 m)   Wt 256 lb (116.1 kg)   SpO2 98%   BMI 34.72 kg/m    Physical Exam Vitals reviewed.  Constitutional:      General: He is not in acute distress.    Appearance: Normal appearance. He is obese. He is not ill-appearing.  Neck:     Comments: Right posterior neck with firm, mobile nodule <1cm, not tender or fixed/matted Cardiovascular:     Rate and Rhythm: Normal rate and regular rhythm.  Pulmonary:     Breath sounds: Normal breath sounds.  Musculoskeletal:     Right lower leg: No edema.     Left lower leg: No edema.     Comments: Left shoulder: discomfort with Apley scratch test, Hawkins, Neers, Empty can   Skin:    General: Skin is warm and dry.  Neurological:     General: No focal deficit present.     Mental Status: He is alert and oriented to person, place, and time. Mental status is at baseline.  Psychiatric:        Mood and Affect: Mood normal.        Behavior: Behavior normal.        Thought Content: Thought content normal.        Judgment: Judgment normal.  No results found for any visits on 01/05/23.    The 10-year ASCVD risk score (Arnett DK, et al., 2019) is: 1.4%    Assessment & Plan:   Problem List Items Addressed This Visit       Cardiovascular and Mediastinum   Primary hypertension - Primary    Blood pressure is not at goal for age and co-morbidities.   Recommendations: continue amlodipine 10 mg daily, lisinopril 40 mg daily; monitor at home; return for nurse visit in 2 weeks to recheck - BP goal <130/80 - monitor and log blood pressures at home - check around the same time each day in a relaxed setting - Limit salt to <2000 mg/day - Follow DASH eating plan (heart healthy diet) - limit alcohol to 2 standard drinks per day for men and 1 per day for women - avoid tobacco products - get at least 2 hours of regular aerobic exercise weekly Patient aware of signs/symptoms requiring  further/urgent evaluation. Labs updated today.      Relevant Medications   amLODipine (NORVASC) 10 MG tablet   Other Relevant Orders   CBC with Differential/Platelet   Comprehensive metabolic panel   Lipid panel     Other   Anxiety and depression    Well controlled on Zoloft 100 mg. No SI/HI. Refills not needed at this time. No new concerns.      Other Visit Diagnoses     Hyperglycemia       Relevant Orders   Comprehensive metabolic panel   Hemoglobin A1c   Neck nodule     Offered [place] to confirm if lymph node or cyst. Patient declined workup at this time. Patient aware of signs/symptoms requiring further/urgent evaluation.     Acute pain of left shoulder     - rest, ice, heat, massage, home exercises (handout provided) - occasional ibuprofen or Aleve (be mindful of your blood pressure) - xray today - if not improving after a few weeks, we can refer to sports medicine - consider physical therapy    Relevant Orders   DG Shoulder Left   Need for influenza vaccination       Relevant Orders   Flu Vaccine QUAD 36+ mos IM (Fluarix, Fluzone & Afluria Quad PF (Completed)       Return in about 2 weeks (around 01/19/2023) for BP check with nurse.    Terrilyn Saver, NP

## 2023-01-05 NOTE — Patient Instructions (Addendum)
Blood pressure is not at goal for age and co-morbidities.   Recommendations: continue amlodipine 10 mg daily, lisinopril 40 mg daily; monitor at home; return for nurse visit in 2 weeks to recheck - BP goal <130/80 - monitor and log blood pressures at home - check around the same time each day in a relaxed setting - Limit salt to <2000 mg/day - Follow DASH eating plan (heart healthy diet) - limit alcohol to 2 standard drinks per day for men and 1 per day for women - avoid tobacco products - get at least 2 hours of regular aerobic exercise weekly Patient aware of signs/symptoms requiring further/urgent evaluation. Labs updated today.   Left shoulder pain: - rest, ice, heat, massage, home exercises (handout provided) - occasional ibuprofen or Aleve (be mindful of your blood pressure) - xray today - if not improving after a few weeks, we can refer to sports medicine - consider physical therapy

## 2023-01-05 NOTE — Assessment & Plan Note (Signed)
Blood pressure is not at goal for age and co-morbidities.   Recommendations: continue amlodipine 10 mg daily, lisinopril 40 mg daily; monitor at home; return for nurse visit in 2 weeks to recheck - BP goal <130/80 - monitor and log blood pressures at home - check around the same time each day in a relaxed setting - Limit salt to <2000 mg/day - Follow DASH eating plan (heart healthy diet) - limit alcohol to 2 standard drinks per day for men and 1 per day for women - avoid tobacco products - get at least 2 hours of regular aerobic exercise weekly Patient aware of signs/symptoms requiring further/urgent evaluation. Labs updated today.

## 2023-01-05 NOTE — Assessment & Plan Note (Signed)
Well controlled on Zoloft 100 mg. No SI/HI. Refills not needed at this time. No new concerns.

## 2023-01-06 ENCOUNTER — Ambulatory Visit: Payer: 59 | Admitting: Internal Medicine

## 2023-01-06 NOTE — Addendum Note (Signed)
Addended by: Terrilyn Saver on: 01/06/2023 07:44 AM   Modules accepted: Orders

## 2023-01-06 NOTE — Progress Notes (Signed)
Minimal drop in hemoglobin, but stable.  AST (liver enzyme) is slightly elevated> Let's recheck in about 2-4 weeks (fasting, minimal alcohol and tylenol between now and then). Schedule a lab appointment. Cholesterol and triglycerides are elevated. Your cardiovascular risk score is low, so we don't have to start meds right away, but I would recommend lifestyle measures to improve this. We can recheck in 3-6 months.> schedule a follow-up appointment  Lifestyle factors for lowering cholesterol include: Diet therapy - heart-healthy diet rich in fruits, veggies, fiber-rich whole grains, lean meats, chicken, fish (at least twice a week), fat-free or 1% dairy products; foods low in saturated/trans fats, cholesterol, sodium, and sugar. Mediterranean diet has shown to be very heart healthy. Regular exercise - recommend at least 30 minutes a day, 5 times per week Weight management     The 10-year ASCVD risk score (Arnett DK, et al., 2019) is: 1.6%   Values used to calculate the score:     Age: 42 years     Sex: Male     Is Non-Hispanic African American: No     Diabetic: No     Tobacco smoker: No     Systolic Blood Pressure: 637 mmHg     Is BP treated: Yes     HDL Cholesterol: 77.4 mg/dL     Total Cholesterol: 233 mg/dL

## 2023-01-08 ENCOUNTER — Other Ambulatory Visit (HOSPITAL_COMMUNITY): Payer: Self-pay

## 2023-01-09 ENCOUNTER — Other Ambulatory Visit: Payer: Self-pay

## 2023-01-13 ENCOUNTER — Encounter: Payer: Self-pay | Admitting: Internal Medicine

## 2023-01-13 ENCOUNTER — Other Ambulatory Visit: Payer: Self-pay

## 2023-01-13 MED ORDER — BUDESONIDE 3 MG PO CPEP
ORAL_CAPSULE | ORAL | 0 refills | Status: DC
  Filled 2023-01-13: qty 42, 28d supply, fill #0

## 2023-01-14 ENCOUNTER — Other Ambulatory Visit: Payer: Self-pay

## 2023-01-22 ENCOUNTER — Other Ambulatory Visit (HOSPITAL_COMMUNITY): Payer: Self-pay

## 2023-01-27 ENCOUNTER — Other Ambulatory Visit: Payer: 59

## 2023-01-27 ENCOUNTER — Other Ambulatory Visit (HOSPITAL_COMMUNITY): Payer: Self-pay

## 2023-01-28 ENCOUNTER — Other Ambulatory Visit (HOSPITAL_COMMUNITY): Payer: Self-pay

## 2023-01-28 ENCOUNTER — Other Ambulatory Visit: Payer: Self-pay

## 2023-01-28 ENCOUNTER — Other Ambulatory Visit: Payer: Self-pay | Admitting: Family Medicine

## 2023-01-28 DIAGNOSIS — I10 Essential (primary) hypertension: Secondary | ICD-10-CM

## 2023-01-28 MED ORDER — AMLODIPINE BESYLATE 10 MG PO TABS
10.0000 mg | ORAL_TABLET | Freq: Every day | ORAL | 1 refills | Status: DC
  Filled 2023-01-28: qty 90, 90d supply, fill #0
  Filled 2023-03-01 – 2023-03-21 (×2): qty 90, 90d supply, fill #1

## 2023-01-28 MED ORDER — LISINOPRIL 40 MG PO TABS
60.0000 mg | ORAL_TABLET | Freq: Every day | ORAL | 1 refills | Status: DC
  Filled 2023-01-28: qty 135, 90d supply, fill #0
  Filled 2023-03-01 – 2023-04-22 (×3): qty 135, 90d supply, fill #1

## 2023-02-02 ENCOUNTER — Other Ambulatory Visit: Payer: Self-pay

## 2023-02-04 ENCOUNTER — Other Ambulatory Visit: Payer: 59

## 2023-02-12 ENCOUNTER — Other Ambulatory Visit (HOSPITAL_COMMUNITY): Payer: Self-pay

## 2023-02-12 ENCOUNTER — Other Ambulatory Visit: Payer: Self-pay | Admitting: Family Medicine

## 2023-02-12 ENCOUNTER — Other Ambulatory Visit: Payer: Self-pay

## 2023-02-12 DIAGNOSIS — F32A Depression, unspecified: Secondary | ICD-10-CM

## 2023-02-12 MED ORDER — SERTRALINE HCL 100 MG PO TABS
200.0000 mg | ORAL_TABLET | Freq: Every day | ORAL | 0 refills | Status: DC
  Filled 2023-02-12 – 2023-03-01 (×2): qty 60, 30d supply, fill #0

## 2023-02-13 ENCOUNTER — Other Ambulatory Visit: Payer: Self-pay

## 2023-02-13 ENCOUNTER — Encounter: Payer: Self-pay | Admitting: Family Medicine

## 2023-02-13 ENCOUNTER — Other Ambulatory Visit (HOSPITAL_COMMUNITY): Payer: Self-pay

## 2023-02-13 DIAGNOSIS — R221 Localized swelling, mass and lump, neck: Secondary | ICD-10-CM

## 2023-02-16 ENCOUNTER — Other Ambulatory Visit: Payer: Self-pay

## 2023-02-16 ENCOUNTER — Other Ambulatory Visit (HOSPITAL_COMMUNITY): Payer: Self-pay

## 2023-02-19 ENCOUNTER — Other Ambulatory Visit (HOSPITAL_COMMUNITY): Payer: Self-pay

## 2023-02-20 ENCOUNTER — Other Ambulatory Visit (HOSPITAL_COMMUNITY): Payer: Self-pay

## 2023-02-20 ENCOUNTER — Other Ambulatory Visit: Payer: Self-pay

## 2023-02-20 ENCOUNTER — Other Ambulatory Visit: Payer: Self-pay | Admitting: Pharmacist

## 2023-02-20 ENCOUNTER — Other Ambulatory Visit: Payer: Self-pay | Admitting: Internal Medicine

## 2023-02-20 DIAGNOSIS — K51919 Ulcerative colitis, unspecified with unspecified complications: Secondary | ICD-10-CM

## 2023-02-20 MED ORDER — RINVOQ 15 MG PO TB24
15.0000 mg | ORAL_TABLET | Freq: Every day | ORAL | 0 refills | Status: DC
  Filled 2023-02-20: qty 30, 30d supply, fill #0

## 2023-02-24 ENCOUNTER — Other Ambulatory Visit (HOSPITAL_COMMUNITY): Payer: Self-pay

## 2023-02-26 ENCOUNTER — Ambulatory Visit: Payer: 59 | Admitting: Internal Medicine

## 2023-03-02 ENCOUNTER — Other Ambulatory Visit: Payer: Self-pay

## 2023-03-20 ENCOUNTER — Other Ambulatory Visit (HOSPITAL_COMMUNITY): Payer: Self-pay

## 2023-03-20 ENCOUNTER — Other Ambulatory Visit: Payer: Self-pay | Admitting: Internal Medicine

## 2023-03-20 DIAGNOSIS — K51919 Ulcerative colitis, unspecified with unspecified complications: Secondary | ICD-10-CM

## 2023-03-21 ENCOUNTER — Encounter: Payer: Self-pay | Admitting: Internal Medicine

## 2023-03-21 ENCOUNTER — Other Ambulatory Visit (HOSPITAL_COMMUNITY): Payer: Self-pay

## 2023-03-23 ENCOUNTER — Other Ambulatory Visit (HOSPITAL_COMMUNITY): Payer: Self-pay

## 2023-03-25 ENCOUNTER — Other Ambulatory Visit: Payer: Self-pay

## 2023-03-25 ENCOUNTER — Other Ambulatory Visit: Payer: Self-pay | Admitting: Pharmacist

## 2023-03-25 ENCOUNTER — Other Ambulatory Visit (HOSPITAL_COMMUNITY): Payer: Self-pay

## 2023-03-25 DIAGNOSIS — K51919 Ulcerative colitis, unspecified with unspecified complications: Secondary | ICD-10-CM

## 2023-03-25 MED ORDER — RINVOQ 15 MG PO TB24
15.0000 mg | ORAL_TABLET | Freq: Every day | ORAL | 0 refills | Status: DC
  Filled 2023-03-25: qty 30, 30d supply, fill #0

## 2023-03-25 MED ORDER — RINVOQ 15 MG PO TB24
15.0000 mg | ORAL_TABLET | Freq: Every day | ORAL | 0 refills | Status: DC
  Filled 2023-03-25 (×2): qty 30, 30d supply, fill #0

## 2023-03-26 ENCOUNTER — Other Ambulatory Visit: Payer: Self-pay

## 2023-04-03 ENCOUNTER — Other Ambulatory Visit: Payer: Self-pay | Admitting: Family Medicine

## 2023-04-03 ENCOUNTER — Other Ambulatory Visit (HOSPITAL_BASED_OUTPATIENT_CLINIC_OR_DEPARTMENT_OTHER): Payer: Self-pay

## 2023-04-03 ENCOUNTER — Other Ambulatory Visit: Payer: Self-pay

## 2023-04-03 DIAGNOSIS — F32A Depression, unspecified: Secondary | ICD-10-CM

## 2023-04-03 MED ORDER — SERTRALINE HCL 100 MG PO TABS
200.0000 mg | ORAL_TABLET | Freq: Every day | ORAL | 0 refills | Status: DC
  Filled 2023-04-03: qty 60, 30d supply, fill #0

## 2023-04-13 ENCOUNTER — Other Ambulatory Visit: Payer: Self-pay

## 2023-04-14 ENCOUNTER — Other Ambulatory Visit (HOSPITAL_COMMUNITY): Payer: Self-pay

## 2023-04-15 ENCOUNTER — Ambulatory Visit: Payer: 59 | Attending: Family Medicine | Admitting: Pharmacist

## 2023-04-15 DIAGNOSIS — Z79899 Other long term (current) drug therapy: Secondary | ICD-10-CM

## 2023-04-15 NOTE — Progress Notes (Signed)
  S: Patient presents today for review of their specialty medication.   Patient is taking Rinvoq for UC. Patient is managed by Dr. Leonides Schanz for this.   Dosing: Adult  45 mg daily x8 weeks then 15 mg once daily thereafter.   Adherence: confirmed  Efficacy: confirmed. Pt reports doing well and is pleased with results.   Monitoring: S/sx thromboembolism: none S/sx malignancy: none S/sx of infection: pt with latent TB hx, cleared for Rinvoq therapy.   Current adverse effects: none   O:     Lab Results  Component Value Date   WBC 5.3 01/05/2023   HGB 12.8 (L) 01/05/2023   HCT 38.1 (L) 01/05/2023   MCV 98.3 01/05/2023   PLT 351.0 01/05/2023      Chemistry      Component Value Date/Time   NA 138 01/05/2023 1401   K 3.8 01/05/2023 1401   CL 101 01/05/2023 1401   CO2 25 01/05/2023 1401   BUN 9 01/05/2023 1401   CREATININE 0.86 01/05/2023 1401      Component Value Date/Time   CALCIUM 9.0 01/05/2023 1401   ALKPHOS 69 01/05/2023 1401   AST 50 (H) 01/05/2023 1401   ALT 49 01/05/2023 1401   BILITOT 0.5 01/05/2023 1401       Lab Results  Component Value Date   CHOL 233 (H) 01/05/2023   HDL 77.40 01/05/2023   LDLCALC 76 03/20/2022   LDLDIRECT 103.0 01/05/2023   TRIG 341.0 (H) 01/05/2023   CHOLHDL 3 01/05/2023     A/P: 1. Medication review: patient currently taking Rinvoq for UC. Reviewed the medication with the patient, including the following: Rinvoq is a medication used to treat UC. Administer with or without food. Swallow tablet whole; do not crush, split, or chew. Possible adverse effects include increased risk of infection, GI upset, hematologic toxicity, hepatic effects, lipid abnormalities, increased risk of malignancy, thromboembolism. Avoid live vaccinations. No recommendation for any changes at this time.   Butch Penny, PharmD, P[place]y [place], CPP Clinical Pharmacist [facility] [phone]

## 2023-04-16 ENCOUNTER — Other Ambulatory Visit: Payer: Self-pay | Admitting: Internal Medicine

## 2023-04-16 ENCOUNTER — Other Ambulatory Visit (HOSPITAL_COMMUNITY): Payer: Self-pay

## 2023-04-16 DIAGNOSIS — K51919 Ulcerative colitis, unspecified with unspecified complications: Secondary | ICD-10-CM

## 2023-04-22 ENCOUNTER — Other Ambulatory Visit: Payer: Self-pay | Admitting: Family Medicine

## 2023-04-22 DIAGNOSIS — F419 Anxiety disorder, unspecified: Secondary | ICD-10-CM

## 2023-04-23 ENCOUNTER — Ambulatory Visit: Payer: 59 | Admitting: Internal Medicine

## 2023-04-23 ENCOUNTER — Other Ambulatory Visit: Payer: Self-pay | Admitting: Pharmacist

## 2023-04-23 ENCOUNTER — Other Ambulatory Visit (HOSPITAL_COMMUNITY): Payer: Self-pay

## 2023-04-23 ENCOUNTER — Encounter: Payer: Self-pay | Admitting: Internal Medicine

## 2023-04-23 ENCOUNTER — Other Ambulatory Visit: Payer: Self-pay

## 2023-04-23 VITALS — BP 130/88 | HR 66 | Ht 72.0 in | Wt 248.0 lb

## 2023-04-23 DIAGNOSIS — K51919 Ulcerative colitis, unspecified with unspecified complications: Secondary | ICD-10-CM

## 2023-04-23 DIAGNOSIS — K5190 Ulcerative colitis, unspecified, without complications: Secondary | ICD-10-CM

## 2023-04-23 DIAGNOSIS — Z23 Encounter for immunization: Secondary | ICD-10-CM | POA: Diagnosis not present

## 2023-04-23 MED ORDER — SERTRALINE HCL 100 MG PO TABS
200.0000 mg | ORAL_TABLET | Freq: Every day | ORAL | 0 refills | Status: DC
  Filled 2023-04-23 – 2023-05-05 (×2): qty 180, 90d supply, fill #0

## 2023-04-23 MED ORDER — RINVOQ 15 MG PO TB24
15.0000 mg | ORAL_TABLET | Freq: Every day | ORAL | 6 refills | Status: DC
Start: 2023-04-23 — End: 2023-10-28
  Filled 2023-04-23: qty 30, 30d supply, fill #0
  Filled 2023-05-13: qty 30, 30d supply, fill #1
  Filled 2023-06-15: qty 30, 30d supply, fill #2
  Filled 2023-07-14: qty 30, 30d supply, fill #3
  Filled 2023-08-11: qty 30, 30d supply, fill #4
  Filled 2023-09-03: qty 30, 30d supply, fill #5
  Filled 2023-09-30: qty 30, 30d supply, fill #6

## 2023-04-23 MED ORDER — NA SULFATE-K SULFATE-MG SULF 17.5-3.13-1.6 GM/177ML PO SOLN
ORAL | 0 refills | Status: DC
  Filled 2023-04-23: qty 354, 28d supply, fill #0

## 2023-04-23 MED ORDER — RINVOQ 15 MG PO TB24
15.0000 mg | ORAL_TABLET | Freq: Every day | ORAL | 6 refills | Status: DC
Start: 2023-04-23 — End: 2023-04-23
  Filled 2023-04-23 (×2): qty 30, 30d supply, fill #0

## 2023-04-23 NOTE — Patient Instructions (Addendum)
You have been scheduled for a colonoscopy. Please follow written instructions given to you at your visit today.  Please pick up your prep supplies at the pharmacy within the next 1-3 days. If you use inhalers (even only as needed), please bring them with you on the day of your procedure.   We have sent the following medications to your pharmacy for you to pick up at your convenience: Suprep, Rinvoq  Please follow a Mediterranean diet   Follow up with Infection disease for TB treatment   A referral was place for dermatology they will call you top schedule appointment  Follow up in 6 months _______________________________________________________  If your blood pressure at your visit was 140/90 or greater, please contact your primary care physician to follow up on this.  _______________________________________________________  If you are age 44 or older, your body mass index should be between 23-30. Your Body mass index is 33.63 kg/m. If this is out of the aforementioned range listed, please consider follow up with your Primary Care Provider.  If you are age 44 or younger, your body mass index should be between 19-25. Your Body mass index is 33.63 kg/m. If this is out of the aformentioned range listed, please consider follow up with your Primary Care Provider.   ________________________________________________________  The [place] [facility] providers would like to encourage you to use [facility] to communicate with providers for non-urgent requests or questions.  Due to long hold times on the telephone, sending your provider a message by [facility] may be a faster and more efficient way to get a response.  Please allow 48 business hours for a response.  Please remember that this is for non-urgent requests.  _______________________________________________________   Due to recent changes in healthcare laws, you may see the results of your imaging and laboratory studies on MyChart before your provider has  had a chance to review them.  We understand that in some cases there may be results that are confusing or concerning to you. Not all laboratory results come back in the same time frame and the provider may be waiting for multiple results in order to interpret others.  Please give [place] 48 hours in order for your provider to thoroughly review all the results before contacting the office for clarification of your results.    Thank you for entrusting me with your care and for choosing [facility], Dr. Eulah Pont

## 2023-04-23 NOTE — Progress Notes (Signed)
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  INTERVAL HISTORY: He has been on Rinvoq now for at least 11 months. He was able to wean off of budesonide successfully without worsening of his symptoms. He is having 2-3 BMs per day on average. Denies blood in the stools. Denies any flare of UC since our clinic visit. He has lost some weight deliberately. He completed about 4-5 months of latent TB therapy but stopped the medication because it was making him feel nauseated. He has not followed with ID since he stopped his latent TB treatment. He got the Shingrix vaccine. Denies ab pain.   Wt Readings from Last 3 Encounters:  04/23/23 248 lb (112.5 kg)  01/05/23 256 lb (116.1 kg)  04/18/22 261 lb (118.4 kg)    IBD CHARACTERISTICS: Diagnosis: Ulcerative colitis Year of diagnosis: 2019 Location: Pancolitis EIM: No Prior surgeries: No Prior medications: Mesalamine, Humira (developed antibodies), budesonide (on-and-off), Entyvio (12/2019-03/2022, never achieved clinical remission), Rinvoq (05/2022-current) Last flare: 12/2019   Outpatient Encounter Medications as of 04/23/2023  Medication Sig   amLODipine (NORVASC) 10 MG tablet Take 1 tablet (10 mg total) by mouth daily.   isoniazid (NYDRAZID) 300 MG tablet Take 1 tablet (300 mg total) by mouth daily.   lisinopril (ZESTRIL) 40 MG tablet Take 1.5 tablets (60 mg total) by mouth daily at 12 noon.   Multiple Vitamin (MULTI-VITAMIN) tablet Take 1 tablet by mouth daily.   pyridOXINE (B-6) 50 MG tablet Take 1 tablet (50 mg total) by mouth daily.   sertraline (ZOLOFT) 100 MG tablet Take 2 tablets (200 mg total) by mouth daily. Must make appointment and have labs for refills   Upadacitinib ER (RINVOQ) 15 MG TB24 Take 1 tablet (15 mg) by mouth daily.   Semaglutide-Weight Management (WEGOVY) 0.25 MG/0.5ML SOAJ  Inject 1 pen (0.25 mg) into the skin once a week. After 4 weeks, increase to 0.5 mg weekly. (Patient not taking: Reported on 04/23/2023)   Semaglutide-Weight Management (WEGOVY) 0.5 MG/0.5ML SOAJ Inject 0.5 mg into the skin once a week. (Patient not taking: Reported on 04/23/2023)   [DISCONTINUED] sertraline (ZOLOFT) 100 MG tablet Take 2 tablets (200 mg total) by mouth daily. Must make appointment and have labs for refills   No facility-administered encounter medications on file as of 04/23/2023.   PHYSICAL EXAM: BP 130/88   Pulse 66   Ht 6' (1.829 m)   Wt 248 lb (112.5 kg)   BMI 33.63 kg/m  General: Well developed white male in no acute distress Head: Normocephalic and atraumatic Eyes:  Sclerae anicteric, conjunctiva pink. Ears: Normal auditory acuity Lungs: Clear throughout to auscultation; no W/R/R. Heart: Regular rate and rhythm; no M/R/G. Abdomen: Soft, non-distended.  BS present.  Non-tender. Musculoskeletal: Symmetrical with no gross deformities  Skin: No lesions on visible extremities Extremities: No edema  Neurological: Alert oriented x 4, grossly non-focal Psychological:  Alert and cooperative. Normal mood and affect  Labs 01/2022: Quant gold positive.  Labs 03/2022: CBC with elevated WBC of 11.6. CMP unremarkable. TSH nml  Labs 03/2022: CRP nml.   Labs 01/2023: CBC with low Hb of 12.8. CMP with mildly elevated AST of 50. HbA1C 5.9%. Lipid panel with elevated triglycerides of 341.   Colonoscopy April 2019 they reported colitis with biopsy showing moderate to severe chronic active colitis with ulceration.  They describe crypt distortion associated with increased lymphocytes, plasma  cells, and lymphoid aggregates within the lamina propria. severely active chronic colitis from the rectum to the transverse colon  Colonoscopy from January 21 for evaluation of ulcerative colitis showed colitis with random biopsy showing diffuse colitis with neutrophilic cryptitis, distortion of crypt  architecture, moderate mononuclear cell expansion of the lamina propria and basal plasmacytosis consistent with moderately active ulcerative colitis.  No dysplasia present.    ASSESSMENT AND PLAN: Ulcerative colitis Latent TB Patient has been doing well on Rinvoq, which was started in 05/2022. He was able to wean off the budesonide therapy and feels that he is in clinical remission at his time. His recents labs in 01/2023 suggest that he is tolerating the medication well. Reviewed his immunizations today and refer to dermatology for annual skin checks while on Rinvoq. Will assess for endoscopic remission by performing a colonoscopy procedure. Patient was previously recommended for latent TB treatment but stopped this therapy a few months early. Thus I recommended that he follow up with ID for further management of his latent TB. - Recommended Mediterranean diet - Continue Rinvoq. Refill - Will give PCV13 vaccine today. Will need PCV20 vaccination in 03/2024. - Patient will schedule follow up with ID for latent TB treatment - Referral to dermatology for annual skin checks - Colonoscopy [facility] - RTC 6 months  Eulah Pont, MD  I spent 43 minutes of time, including in depth chart review, independent review of results as outlined above, communicating results with the patient directly, face-to-face time with the patient, coordinating care, ordering studies and medications as appropriate, and documentation.

## 2023-05-05 ENCOUNTER — Other Ambulatory Visit (HOSPITAL_COMMUNITY): Payer: Self-pay

## 2023-05-12 ENCOUNTER — Ambulatory Visit: Payer: 59 | Admitting: Internal Medicine

## 2023-05-13 ENCOUNTER — Other Ambulatory Visit (HOSPITAL_COMMUNITY): Payer: Self-pay

## 2023-05-19 ENCOUNTER — Other Ambulatory Visit (HOSPITAL_COMMUNITY): Payer: Self-pay

## 2023-05-23 ENCOUNTER — Encounter: Payer: Self-pay | Admitting: Family Medicine

## 2023-05-26 ENCOUNTER — Encounter: Payer: Self-pay | Admitting: Internal Medicine

## 2023-06-02 ENCOUNTER — Encounter: Payer: 59 | Admitting: Internal Medicine

## 2023-06-12 ENCOUNTER — Other Ambulatory Visit (HOSPITAL_COMMUNITY): Payer: Self-pay

## 2023-06-15 ENCOUNTER — Other Ambulatory Visit (HOSPITAL_COMMUNITY): Payer: Self-pay

## 2023-06-16 ENCOUNTER — Encounter: Payer: Self-pay | Admitting: Internal Medicine

## 2023-06-16 ENCOUNTER — Other Ambulatory Visit: Payer: Self-pay | Admitting: Family Medicine

## 2023-06-16 DIAGNOSIS — I10 Essential (primary) hypertension: Secondary | ICD-10-CM

## 2023-06-17 ENCOUNTER — Other Ambulatory Visit: Payer: Self-pay

## 2023-06-17 ENCOUNTER — Other Ambulatory Visit (HOSPITAL_COMMUNITY): Payer: Self-pay

## 2023-06-18 ENCOUNTER — Other Ambulatory Visit (HOSPITAL_COMMUNITY): Payer: Self-pay

## 2023-06-18 MED ORDER — AMLODIPINE BESYLATE 10 MG PO TABS
10.0000 mg | ORAL_TABLET | Freq: Every day | ORAL | 1 refills | Status: DC
Start: 2023-06-18 — End: 2024-05-10
  Filled 2023-06-18 – 2023-06-30 (×2): qty 90, 90d supply, fill #0
  Filled 2023-10-21: qty 90, 90d supply, fill #1

## 2023-06-18 MED ORDER — LISINOPRIL 40 MG PO TABS
60.0000 mg | ORAL_TABLET | Freq: Every day | ORAL | 1 refills | Status: DC
Start: 2023-06-18 — End: 2024-05-10
  Filled 2023-06-18 – 2023-07-04 (×3): qty 135, 90d supply, fill #0
  Filled 2023-10-20: qty 135, 90d supply, fill #1

## 2023-06-19 ENCOUNTER — Other Ambulatory Visit (HOSPITAL_COMMUNITY): Payer: Self-pay

## 2023-06-25 ENCOUNTER — Encounter: Payer: 59 | Admitting: Internal Medicine

## 2023-06-30 ENCOUNTER — Other Ambulatory Visit (HOSPITAL_COMMUNITY): Payer: Self-pay

## 2023-07-01 ENCOUNTER — Other Ambulatory Visit: Payer: Self-pay

## 2023-07-04 ENCOUNTER — Other Ambulatory Visit (HOSPITAL_COMMUNITY): Payer: Self-pay

## 2023-07-14 ENCOUNTER — Other Ambulatory Visit (HOSPITAL_COMMUNITY): Payer: Self-pay

## 2023-07-15 ENCOUNTER — Other Ambulatory Visit (HOSPITAL_COMMUNITY): Payer: Self-pay

## 2023-07-18 ENCOUNTER — Encounter: Payer: Self-pay | Admitting: Certified Registered Nurse Anesthetist

## 2023-07-24 ENCOUNTER — Encounter: Payer: Self-pay | Admitting: Internal Medicine

## 2023-07-24 ENCOUNTER — Other Ambulatory Visit (HOSPITAL_COMMUNITY): Payer: Self-pay

## 2023-07-24 ENCOUNTER — Ambulatory Visit (AMBULATORY_SURGERY_CENTER): Payer: 59 | Admitting: Internal Medicine

## 2023-07-24 VITALS — BP 115/67 | HR 73 | Temp 98.0°F | Resp 18 | Ht 72.0 in | Wt 248.0 lb

## 2023-07-24 DIAGNOSIS — I10 Essential (primary) hypertension: Secondary | ICD-10-CM | POA: Diagnosis not present

## 2023-07-24 DIAGNOSIS — D122 Benign neoplasm of ascending colon: Secondary | ICD-10-CM

## 2023-07-24 DIAGNOSIS — K5120 Ulcerative (chronic) proctitis without complications: Secondary | ICD-10-CM

## 2023-07-24 DIAGNOSIS — K5190 Ulcerative colitis, unspecified, without complications: Secondary | ICD-10-CM | POA: Diagnosis not present

## 2023-07-24 DIAGNOSIS — D124 Benign neoplasm of descending colon: Secondary | ICD-10-CM

## 2023-07-24 DIAGNOSIS — D123 Benign neoplasm of transverse colon: Secondary | ICD-10-CM | POA: Diagnosis not present

## 2023-07-24 DIAGNOSIS — K5140 Inflammatory polyps of colon without complications: Secondary | ICD-10-CM

## 2023-07-24 DIAGNOSIS — K5150 Left sided colitis without complications: Secondary | ICD-10-CM | POA: Diagnosis not present

## 2023-07-24 DIAGNOSIS — K529 Noninfective gastroenteritis and colitis, unspecified: Secondary | ICD-10-CM | POA: Diagnosis not present

## 2023-07-24 MED ORDER — SODIUM CHLORIDE 0.9 % IV SOLN
500.0000 mL | Freq: Once | INTRAVENOUS | Status: DC
Start: 2023-07-24 — End: 2023-07-24

## 2023-07-24 MED ORDER — MESALAMINE 1000 MG RE SUPP
1000.0000 mg | Freq: Every day | RECTAL | 6 refills | Status: DC
  Filled 2023-07-24: qty 30, 30d supply, fill #0

## 2023-07-24 MED ORDER — MESALAMINE 1000 MG RE SUPP
1000.0000 mg | Freq: Every day | RECTAL | 6 refills | Status: DC
  Filled 2023-07-24: qty 30, 30d supply, fill #0
  Filled 2023-08-22: qty 30, 30d supply, fill #1

## 2023-07-24 NOTE — Progress Notes (Signed)
Sedate, gd SR, tolerated procedure well, VSS, report to RN 

## 2023-07-24 NOTE — Progress Notes (Signed)
Medical record updated 

## 2023-07-24 NOTE — Patient Instructions (Addendum)
Recommendation:           - Discharge patient to home (with escort).                           - Overall it appears that the Rinvoq is helping                            with his ulcerative colitis. There does appear to                            be active disease present in the rectal area.                           - Await pathology results.                           - Recommend mesalamine suppository 1 g daily in                            addition to your Rinvoq to help with your                            ulcerative colitis control.                           - Return to GI clinic in 3 months.                           - The findings and recommendations were discussed                            with the patient.  YOU HAD AN ENDOSCOPIC PROCEDURE TODAY AT THE [facility]:   Refer to the procedure report that was given to you for any specific questions about what was found during the examination.  If the procedure report does not answer your questions, please call your gastroenterologist to clarify.  If you requested that your care partner not be given the details of your procedure findings, then the procedure report has been included in a sealed envelope for you to review at your convenience later.  YOU SHOULD EXPECT: Some feelings of bloating in the abdomen. Passage of more gas than usual.  Walking can help get rid of the air that was put into your GI tract during the procedure and reduce the bloating. If you had a lower endoscopy (such as a colonoscopy or flexible sigmoidoscopy) you may notice spotting of blood in your stool or on the toilet paper. If you underwent a bowel prep for your procedure, you may not have a normal bowel movement for a few days.  Please Note:  You might notice some irritation and congestion in your nose or some drainage.  This is from the oxygen used during your procedure.  There is no need for concern and it should clear up in a day or so.  SYMPTOMS TO  REPORT IMMEDIATELY:  Following lower endoscopy (colonoscopy or flexible sigmoidoscopy):  Excessive amounts of blood in the stool  Significant tenderness or worsening of abdominal pains  Swelling  of the abdomen that is new, acute  Fever of 100F or higher   For urgent or emergent issues, a gastroenterologist can be reached at any hour by calling [phone]. Do not use MyChart messaging for urgent concerns.    DIET:  We do recommend a small meal at first, but then you may proceed to your regular diet.  Drink plenty of fluids but you should avoid alcoholic beverages for 24 hours.  ACTIVITY:  You should plan to take it easy for the rest of today and you should NOT DRIVE or use heavy machinery until tomorrow (because of the sedation medicines used during the test).    FOLLOW UP: Our staff will call the number listed on your records the next business day following your procedure.  We will call around 7:15- 8:00 am to check on you and address any questions or concerns that you may have regarding the information given to you following your procedure. If we do not reach you, we will leave a message.     If any biopsies were taken you will be contacted by phone or by letter within the next 1-3 weeks.  Please call us at [phone] if you have not heard about the biopsies in 3 weeks.    SIGNATURES/CONFIDENTIALITY: You and/or your care partner have signed paperwork which will be entered into your electronic medical record.  These signatures attest to the fact that that the information above on your After Visit Summary has been reviewed and is understood.  Full responsibility of the confidentiality of this discharge information lies with you and/or your care-partner.

## 2023-07-24 NOTE — Progress Notes (Signed)
GASTROENTEROLOGY PROCEDURE H&P NOTE   Primary Care Physician: Clayborne Dana, NP    Reason for Procedure:   Ulcerative colitis  Plan:    Colonoscopy  Patient is appropriate for endoscopic procedure(s) in the ambulatory ([facility]) setting.  The nature of the procedure, as well as the risks, benefits, and alternatives were carefully and thoroughly reviewed with the patient. Ample time for discussion and questions allowed. The patient understood, was satisfied, and agreed to proceed.     HPI: Krishna Schnetzer is a 42 y.o. male who presents for colonoscopy for evaluation of ulcerative colitis .  Patient was most recently seen in the Gastroenterology Clinic on 04/23/23.  No interval change in medical history since that appointment. Please refer to that note for full details regarding GI history and clinical presentation.   Past Medical History:  Diagnosis Date   Hypertension    Ulcerative colitis (HCC)    Ulcerative colitis (HCC) 02/12/2022    Past Surgical History:  Procedure Laterality Date   COLONOSCOPY      Prior to Admission medications   Medication Sig Start Date End Date Taking? Authorizing Provider  amLODipine (NORVASC) 10 MG tablet Take 1 tablet (10 mg total) by mouth daily. 06/18/23   Clayborne Dana, NP  isoniazid (NYDRAZID) 300 MG tablet Take 1 tablet (300 mg total) by mouth daily. 04/18/22   Comer, Belia Heman, MD  lisinopril (ZESTRIL) 40 MG tablet Take 1.5 tablets (60 mg total) by mouth daily at 12 noon. 06/18/23   Clayborne Dana, NP  Multiple Vitamin (MULTI-VITAMIN) tablet Take 1 tablet by mouth daily.    [provider]  pyridOXINE (B-6) 50 MG tablet Take 1 tablet (50 mg total) by mouth daily. 04/18/22   Gardiner Barefoot, MD  sertraline (ZOLOFT) 100 MG tablet Take 2 tablets (200 mg total) by mouth daily. Must make appointment and have labs for refills 04/23/23   Hyman Hopes B, NP  Upadacitinib ER (RINVOQ) 15 MG TB24 Take 1 tablet (15 mg) by mouth daily. 04/23/23    Quentin Angst, MD    Current Outpatient Medications  Medication Sig Dispense Refill   amLODipine (NORVASC) 10 MG tablet Take 1 tablet (10 mg total) by mouth daily. 90 tablet 1   isoniazid (NYDRAZID) 300 MG tablet Take 1 tablet (300 mg total) by mouth daily. 30 tablet 8   lisinopril (ZESTRIL) 40 MG tablet Take 1.5 tablets (60 mg total) by mouth daily at 12 noon. 135 tablet 1   Multiple Vitamin (MULTI-VITAMIN) tablet Take 1 tablet by mouth daily.     pyridOXINE (B-6) 50 MG tablet Take 1 tablet (50 mg total) by mouth daily. 30 tablet 8   sertraline (ZOLOFT) 100 MG tablet Take 2 tablets (200 mg total) by mouth daily. Must make appointment and have labs for refills 180 tablet 0   Upadacitinib ER (RINVOQ) 15 MG TB24 Take 1 tablet (15 mg) by mouth daily. 30 tablet 6   Current Facility-Administered Medications  Medication Dose Route Frequency Provider Last Rate Last Admin   0.9 %  sodium chloride infusion  500 mL Intravenous Once Imogene Burn, MD        Allergies as of 07/24/2023   (No Known Allergies)    Family History  Problem Relation Age of Onset   COPD Mother    Healthy Father    Ovarian cancer Paternal Aunt    Colon cancer Cousin    Colon polyps Neg Hx     Social History  Socioeconomic History   Marital status: Single    Spouse name: Not on file   Number of children: Not on file   Years of education: Not on file   Highest education level: Not on file  Occupational History   Not on file  Tobacco Use   Smoking status: Never    Passive exposure: Past   Smokeless tobacco: Current    Types: Snuff   Tobacco comments:    States trying to stop  Vaping Use   Vaping status: Never Used  Substance and Sexual Activity   Alcohol use: Yes    Comment: occasionally   Drug use: Never   Sexual activity: Not on file  Other Topics Concern   Not on file  Social History Narrative   Not on file   Social Determinants of Health   Financial Resource Strain: Not on file   Food Insecurity: Not on file  Transportation Needs: Not on file  Physical Activity: Not on file  Stress: Not on file  Social Connections: Not on file  Intimate Partner Violence: Not on file    Physical Exam: Vital signs in last 24 hours: BP (!) 154/90   Pulse 96   Temp 98 F (36.7 C) (Temporal)   Ht 6' (1.829 m)   Wt 248 lb (112.5 kg)   SpO2 97%   BMI 33.63 kg/m  GEN: NAD EYE: Sclerae anicteric ENT: MMM CV: Non-tachycardic Pulm: No increased WOB GI: Soft NEURO:  Alert & Oriented   Eulah Pont, MD [facility]   07/24/2023 2:08 PM

## 2023-07-24 NOTE — Op Note (Signed)
[facility] Patient Name: Zachary [name] Procedure Date: 07/24/2023 2:07 PM MRN: [id number] Endoscopist: Madelyn Brunner Lane , , [phone] Age: 42 Referring MD:  Date of Birth: [birth year] Gender: Male Account #: [id number] Procedure:                Colonoscopy Indications:              Follow-up of chronic ulcerative pancolitis Medicines:                Monitored Anesthesia Care Procedure:                Pre-Anesthesia Assessment:                           - Prior to the procedure, a History and Physical                            was performed, and patient medications and                            allergies were reviewed. The patient's tolerance of                            previous anesthesia was also reviewed. The risks                            and benefits of the procedure and the sedation                            options and risks were discussed with the patient.                            All questions were answered, and informed consent                            was obtained. Prior Anticoagulants: The patient has                            taken no anticoagulant or antiplatelet agents. ASA                            Grade Assessment: II - A patient with mild systemic                            disease. After reviewing the risks and benefits,                            the patient was deemed in satisfactory condition to                            undergo the procedure.                           After obtaining informed consent, the colonoscope  was passed under direct vision. Throughout the                            procedure, the patient's blood pressure, pulse, and                            oxygen saturations were monitored continuously. The                            Olympus Scope SN [id number] was introduced through the                            anus and advanced to the the cecum, identified by                             appendiceal orifice and ileocecal valve. The                            colonoscopy was performed without difficulty. The                            patient tolerated the procedure well. The quality                            of the bowel preparation was adequate. The terminal                            ileum, ileocecal valve, appendiceal orifice, and                            rectum were photographed. Scope In: 2:18:29 PM Scope Out: 2:43:34 PM Scope Withdrawal Time: 0 hours 22 minutes 18 seconds  Total Procedure Duration: 0 hours 25 minutes 5 seconds  Findings:                 The terminal ileum appeared normal.                           Six sessile polyps were found in the transverse                            colon and ascending colon. The polyps were 4 to 12                            mm in size. These polyps were removed with a cold                            snare. Resection and retrieval were complete.                           Inflammation characterized by congestion (edema),                            pseudopolyps and scarring was found in a  continuous                            and circumferential pattern from the rectum to the                            cecum. In the rectum there was notable worsening of                            the inflammation characterized by ulceration,                            erythema, and edema. No sites were spared. The                            inflammation was mild in severity, and when                            compared to previous examinations, the findings are                            improved. Biopsies were taken of the right colon                            (cecum, ascending colon, and proximal transverse                            colon), left colon (distal transverse colon,                            descending colon, sigmoid colon), and rectum with a                            cold forceps for histology.                           A 7  mm polyp was found in the descending colon. The                            polyp was sessile. The polyp was removed with a                            cold snare. Resection and retrieval were complete. Complications:            No immediate complications. Estimated Blood Loss:     Estimated blood loss was minimal. Impression:               - The examined portion of the ileum was normal.                           - Six 4 to 12 mm polyps in the transverse colon and                            in the  ascending colon, removed with a cold snare.                            Resected and retrieved.                           - Inflammation was found from the rectum to the                            cecum. This was mild in severity, improved compared                            to previous examinations. Biopsied.                           - One 7 mm polyp in the descending colon, removed                            with a cold snare. Resected and retrieved. Recommendation:           - Discharge patient to home (with escort).                           - Overall it appears that the Rinvoq is helping                            with his ulcerative colitis. There does appear to                            be active disease present in the rectal area.                           - Await pathology results.                           - Recommend mesalamine suppository 1 g daily in                            addition to your Rinvoq to help with your                            ulcerative colitis control.                           - Return to GI clinic in 3 months.                           - The findings and recommendations were discussed                            with the patient. Dr Particia Lather "Alan Ripper" [place],  07/24/2023 2:57:17 PM

## 2023-07-24 NOTE — Progress Notes (Signed)
Called to room to assist during endoscopic procedure.  Patient ID and intended procedure confirmed with present staff. Received instructions for my participation in the procedure from the performing physician.  

## 2023-07-27 ENCOUNTER — Other Ambulatory Visit (HOSPITAL_COMMUNITY): Payer: Self-pay

## 2023-07-27 ENCOUNTER — Other Ambulatory Visit: Payer: Self-pay

## 2023-07-27 ENCOUNTER — Telehealth: Payer: Self-pay

## 2023-07-27 ENCOUNTER — Other Ambulatory Visit: Payer: Self-pay | Admitting: Family Medicine

## 2023-07-27 DIAGNOSIS — F419 Anxiety disorder, unspecified: Secondary | ICD-10-CM

## 2023-07-27 DIAGNOSIS — F32A Depression, unspecified: Secondary | ICD-10-CM

## 2023-07-27 MED ORDER — SERTRALINE HCL 100 MG PO TABS
200.0000 mg | ORAL_TABLET | Freq: Every day | ORAL | 0 refills | Status: DC
  Filled 2023-07-27: qty 60, 30d supply, fill #0

## 2023-07-27 NOTE — Telephone Encounter (Signed)
  Follow up Call-     07/24/2023    1:56 PM  Call back number  Post procedure Call Back phone  # [phone]  Permission to leave phone message Yes     Patient questions:  Do you have a fever, pain , or abdominal swelling? No. Pain Score  0 *  Have you tolerated food without any problems? Yes.    Have you been able to return to your normal activities? Yes.    Do you have any questions about your discharge instructions: Diet   No. Medications  No. Follow up visit  No.  Do you have questions or concerns about your Care? No.  Actions: * If pain score is 4 or above: No action needed, pain <4.

## 2023-08-06 ENCOUNTER — Other Ambulatory Visit (HOSPITAL_COMMUNITY): Payer: Self-pay

## 2023-08-07 ENCOUNTER — Encounter: Payer: Self-pay | Admitting: Internal Medicine

## 2023-08-10 ENCOUNTER — Other Ambulatory Visit: Payer: Self-pay

## 2023-08-11 ENCOUNTER — Other Ambulatory Visit (HOSPITAL_COMMUNITY): Payer: Self-pay

## 2023-08-22 ENCOUNTER — Other Ambulatory Visit (HOSPITAL_COMMUNITY): Payer: Self-pay

## 2023-08-26 ENCOUNTER — Other Ambulatory Visit: Payer: Self-pay | Admitting: Family Medicine

## 2023-08-26 ENCOUNTER — Other Ambulatory Visit: Payer: Self-pay

## 2023-08-26 DIAGNOSIS — F419 Anxiety disorder, unspecified: Secondary | ICD-10-CM

## 2023-08-26 MED ORDER — SERTRALINE HCL 100 MG PO TABS
200.0000 mg | ORAL_TABLET | Freq: Every day | ORAL | 0 refills | Status: DC
  Filled 2023-08-26: qty 60, 30d supply, fill #0

## 2023-09-03 ENCOUNTER — Other Ambulatory Visit (HOSPITAL_COMMUNITY): Payer: Self-pay

## 2023-09-03 ENCOUNTER — Other Ambulatory Visit (HOSPITAL_COMMUNITY): Payer: Self-pay | Admitting: Pharmacy Technician

## 2023-09-03 NOTE — Progress Notes (Signed)
Specialty Pharmacy Refill Coordination Note  Zachary [name] is a 42 y.o. male contacted today regarding refills of specialty medication(s) Upadacitinib   Patient requested Delivery   Delivery date: 09/08/23   Verified address: [address] [place], [place]   Medication will be filled on 09/07/23.

## 2023-09-07 ENCOUNTER — Other Ambulatory Visit: Payer: Self-pay

## 2023-09-09 ENCOUNTER — Other Ambulatory Visit (HOSPITAL_COMMUNITY): Payer: Self-pay

## 2023-09-24 ENCOUNTER — Other Ambulatory Visit: Payer: Self-pay

## 2023-09-30 ENCOUNTER — Other Ambulatory Visit: Payer: Self-pay

## 2023-09-30 NOTE — Progress Notes (Signed)
Specialty Pharmacy Refill Coordination Note  Zachary [name] is a 42 y.o. male contacted today regarding refills of specialty medication(s) Upadacitinib   Patient requested Delivery   Delivery date: 10/06/23   Verified address: [address] [place], [place] [address]   Medication will be filled on 10/05/23.

## 2023-10-04 ENCOUNTER — Other Ambulatory Visit: Payer: Self-pay | Admitting: Family Medicine

## 2023-10-04 DIAGNOSIS — F32A Depression, unspecified: Secondary | ICD-10-CM

## 2023-10-05 ENCOUNTER — Other Ambulatory Visit: Payer: Self-pay

## 2023-10-05 ENCOUNTER — Other Ambulatory Visit (HOSPITAL_COMMUNITY): Payer: Self-pay

## 2023-10-05 MED ORDER — SERTRALINE HCL 100 MG PO TABS
200.0000 mg | ORAL_TABLET | Freq: Every day | ORAL | 0 refills | Status: DC
  Filled 2023-10-05: qty 60, 30d supply, fill #0

## 2023-10-20 ENCOUNTER — Other Ambulatory Visit (HOSPITAL_COMMUNITY): Payer: Self-pay

## 2023-10-20 ENCOUNTER — Other Ambulatory Visit: Payer: Self-pay

## 2023-10-21 ENCOUNTER — Other Ambulatory Visit (HOSPITAL_COMMUNITY): Payer: Self-pay

## 2023-10-27 ENCOUNTER — Other Ambulatory Visit (HOSPITAL_COMMUNITY): Payer: Self-pay

## 2023-10-28 ENCOUNTER — Other Ambulatory Visit: Payer: Self-pay | Admitting: Pharmacist

## 2023-10-28 ENCOUNTER — Encounter (HOSPITAL_COMMUNITY): Payer: Self-pay

## 2023-10-28 ENCOUNTER — Other Ambulatory Visit (HOSPITAL_COMMUNITY): Payer: Self-pay

## 2023-10-28 ENCOUNTER — Other Ambulatory Visit: Payer: Self-pay | Admitting: Internal Medicine

## 2023-10-28 ENCOUNTER — Other Ambulatory Visit: Payer: Self-pay

## 2023-10-28 DIAGNOSIS — K51919 Ulcerative colitis, unspecified with unspecified complications: Secondary | ICD-10-CM

## 2023-10-28 MED ORDER — RINVOQ 15 MG PO TB24
15.0000 mg | ORAL_TABLET | Freq: Every day | ORAL | 6 refills | Status: DC
Start: 2023-10-28 — End: 2023-12-30
  Filled 2023-10-28 (×2): qty 30, 30d supply, fill #0
  Filled 2023-11-26: qty 30, 30d supply, fill #1

## 2023-10-28 MED ORDER — RINVOQ 15 MG PO TB24
15.0000 mg | ORAL_TABLET | Freq: Every day | ORAL | 6 refills | Status: DC
Start: 2023-10-28 — End: 2023-10-28
  Filled 2023-10-28 (×2): qty 30, 30d supply, fill #0

## 2023-10-28 NOTE — Progress Notes (Signed)
Please see OV from 04/2023 for complete documentation.   Butch Penny, PharmD, [place]y [place], CPP Clinical Pharmacist [facility] [phone]

## 2023-10-28 NOTE — Progress Notes (Signed)
Specialty Pharmacy Refill Coordination Note  Zachary [name] is a 42 y.o. male contacted today regarding refills of specialty medication(s) Upadacitinib   Patient requested Delivery   Delivery date: 10/30/23   Verified address: [address], [place], [place] [address]   Medication will be filled when refill request is approved.

## 2023-10-28 NOTE — Progress Notes (Signed)
10/28/23: Rinvoq is too soon to fill per insurance until 12/02. Confirmed with patient that it is ok to mail next refill on 12/02--delivery date 11/03/23.

## 2023-10-29 ENCOUNTER — Other Ambulatory Visit: Payer: Self-pay | Admitting: Family Medicine

## 2023-10-29 DIAGNOSIS — F32A Depression, unspecified: Secondary | ICD-10-CM

## 2023-11-01 ENCOUNTER — Encounter: Payer: Self-pay | Admitting: Internal Medicine

## 2023-11-01 DIAGNOSIS — Z419 Encounter for procedure for purposes other than remedying health state, unspecified: Secondary | ICD-10-CM | POA: Diagnosis not present

## 2023-11-02 ENCOUNTER — Other Ambulatory Visit: Payer: Self-pay

## 2023-11-02 ENCOUNTER — Ambulatory Visit: Payer: 59 | Admitting: Internal Medicine

## 2023-11-05 ENCOUNTER — Other Ambulatory Visit: Payer: Self-pay | Admitting: Family Medicine

## 2023-11-05 DIAGNOSIS — F32A Depression, unspecified: Secondary | ICD-10-CM

## 2023-11-06 ENCOUNTER — Other Ambulatory Visit (HOSPITAL_COMMUNITY): Payer: Self-pay

## 2023-11-06 ENCOUNTER — Other Ambulatory Visit: Payer: Self-pay

## 2023-11-06 MED ORDER — SERTRALINE HCL 100 MG PO TABS
200.0000 mg | ORAL_TABLET | Freq: Every day | ORAL | 0 refills | Status: DC
  Filled 2023-11-06: qty 60, 30d supply, fill #0

## 2023-11-13 ENCOUNTER — Ambulatory Visit: Payer: 59 | Admitting: Nurse Practitioner

## 2023-11-25 ENCOUNTER — Other Ambulatory Visit: Payer: Self-pay | Admitting: Family Medicine

## 2023-11-25 DIAGNOSIS — I10 Essential (primary) hypertension: Secondary | ICD-10-CM

## 2023-11-26 ENCOUNTER — Other Ambulatory Visit: Payer: Self-pay

## 2023-11-26 NOTE — Progress Notes (Signed)
Specialty Pharmacy Refill Coordination Note  Zachary [name] is a 42 y.o. male contacted today regarding refills of specialty medication(s) Upadacitinib (Rinvoq)   Patient requested Delivery   Delivery date: 12/01/23   Verified address: [address], [place], [place] [address]   Medication will be filled on 11/30/23.

## 2023-11-29 ENCOUNTER — Other Ambulatory Visit: Payer: Self-pay | Admitting: Family Medicine

## 2023-11-29 DIAGNOSIS — F32A Depression, unspecified: Secondary | ICD-10-CM

## 2023-11-30 ENCOUNTER — Other Ambulatory Visit (HOSPITAL_COMMUNITY): Payer: Self-pay

## 2023-11-30 ENCOUNTER — Other Ambulatory Visit: Payer: Self-pay

## 2023-12-02 DIAGNOSIS — Z419 Encounter for procedure for purposes other than remedying health state, unspecified: Secondary | ICD-10-CM | POA: Diagnosis not present

## 2023-12-25 ENCOUNTER — Other Ambulatory Visit: Payer: Self-pay

## 2023-12-25 ENCOUNTER — Telehealth: Payer: Self-pay

## 2023-12-25 NOTE — Telephone Encounter (Signed)
Pharmacy Patient Advocate Encounter   Received notification from CoverMyMeds that prior authorization for Rinvoq 15 er tablets is required/requested.   Insurance verification completed.   The patient is insured through [facility] .   Per test claim: PA required; PA started via CoverMyMeds. KEY [id number] . Waiting for clinical questions to populate.

## 2023-12-28 ENCOUNTER — Other Ambulatory Visit: Payer: Self-pay

## 2023-12-30 ENCOUNTER — Other Ambulatory Visit (HOSPITAL_COMMUNITY): Payer: Self-pay

## 2023-12-30 ENCOUNTER — Telehealth: Payer: Self-pay | Admitting: Internal Medicine

## 2023-12-30 ENCOUNTER — Telehealth: Payer: Self-pay | Admitting: Pharmacy Technician

## 2023-12-30 ENCOUNTER — Other Ambulatory Visit: Payer: Self-pay

## 2023-12-30 ENCOUNTER — Other Ambulatory Visit: Payer: Self-pay | Admitting: Internal Medicine

## 2023-12-30 DIAGNOSIS — K51919 Ulcerative colitis, unspecified with unspecified complications: Secondary | ICD-10-CM

## 2023-12-30 MED ORDER — RINVOQ 30 MG PO TB24
30.0000 mg | ORAL_TABLET | Freq: Every day | ORAL | 5 refills | Status: DC
  Filled 2023-12-31: qty 30, 30d supply, fill #0
  Filled 2024-01-29 (×2): qty 30, 30d supply, fill #1
  Filled 2024-02-24: qty 30, 30d supply, fill #2
  Filled 2024-03-22: qty 30, 30d supply, fill #3
  Filled 2024-04-20: qty 30, 30d supply, fill #4

## 2023-12-30 NOTE — Progress Notes (Signed)
Refills were denied - not appropriate. Called office and explained why a new prescription was necessary, and message was being sent to provider.

## 2023-12-30 NOTE — Progress Notes (Signed)
Specialty Pharmacy Refill Coordination Note  Zachary [name] is a 43 y.o. male contacted today regarding refills of specialty medication(s) Upadacitinib (Rinvoq)   Patient requested Delivery   Delivery date: 01/05/24   Verified address: [address], [place], [place] [address]   Medication will be filled on 01/04/24.   Patient no longer has [employer]. New prescription requested from prescriber. New insurance requires PA; [employer].

## 2023-12-30 NOTE — Telephone Encounter (Signed)
Pharmacy Patient Advocate Encounter   Received notification from CoverMyMeds that prior authorization for RINVOQ 15MG  is required/requested.   Insurance verification completed.   The patient is insured through [facility] [place] .   Per test claim: PA required; PA submitted to above mentioned insurance via CoverMyMeds Key/confirmation #/EOC [id number] Status is pending

## 2023-12-30 NOTE — Telephone Encounter (Signed)
I called the patient and he states that he has new health insurance. He is going to bring in his new health insurance card soon. I discussed with the patient whether or not he would be interested in increasing his dose of Rinvoq in order to help try to control any residual active disease. I do think that the Rinvoq has helped with his symptoms thus far. Will increase his Rinvoq from 15 mg every day to 30 mg every day. He is agreeable to this.

## 2023-12-30 NOTE — Telephone Encounter (Signed)
Inbound call from [facility] requesting for a new prescription for Rinvoq to be faxed to [phone]. Please advise, thank you.

## 2023-12-30 NOTE — Telephone Encounter (Signed)
Pharmacy Patient Advocate Encounter  Received notification from [facility] that Prior Authorization for [facility] 15MG  has been APPROVED from 1.29.25 to 1.29.26. Ran test claim, Copay is $4. This test claim was processed through [facility]- copay amounts may vary at other pharmacies due to pharmacy/plan contracts, or as the patient moves through the different stages of their insurance plan.   PA #/Case ID/Reference #:  [id number]

## 2023-12-31 ENCOUNTER — Other Ambulatory Visit (HOSPITAL_COMMUNITY): Payer: Self-pay

## 2023-12-31 ENCOUNTER — Other Ambulatory Visit: Payer: Self-pay

## 2023-12-31 NOTE — Telephone Encounter (Signed)
Hi there Dr Leonides Schanz. Looks like the prior authorization covered the drug/medication and not only the dose/strength. Test billing results for the 30mg  returned a copay of $4. Additionally it looks like [facility] has spoke with the patient and it is scheduled to be filled on 2.3.25. Please let me know if further assistance is needed.

## 2024-01-02 DIAGNOSIS — Z419 Encounter for procedure for purposes other than remedying health state, unspecified: Secondary | ICD-10-CM | POA: Diagnosis not present

## 2024-01-04 ENCOUNTER — Other Ambulatory Visit: Payer: Self-pay

## 2024-01-08 ENCOUNTER — Other Ambulatory Visit: Payer: Self-pay

## 2024-01-11 ENCOUNTER — Other Ambulatory Visit: Payer: Self-pay | Admitting: Family Medicine

## 2024-01-11 DIAGNOSIS — F419 Anxiety disorder, unspecified: Secondary | ICD-10-CM

## 2024-01-11 DIAGNOSIS — I10 Essential (primary) hypertension: Secondary | ICD-10-CM

## 2024-01-12 ENCOUNTER — Other Ambulatory Visit (HOSPITAL_COMMUNITY): Payer: Self-pay

## 2024-01-18 ENCOUNTER — Encounter: Payer: Self-pay | Admitting: Family Medicine

## 2024-01-18 ENCOUNTER — Other Ambulatory Visit: Payer: Self-pay | Admitting: Family Medicine

## 2024-01-18 DIAGNOSIS — I10 Essential (primary) hypertension: Secondary | ICD-10-CM

## 2024-01-18 DIAGNOSIS — F32A Depression, unspecified: Secondary | ICD-10-CM

## 2024-01-22 ENCOUNTER — Ambulatory Visit: Payer: 59 | Admitting: Physician Assistant

## 2024-01-25 ENCOUNTER — Other Ambulatory Visit: Payer: Self-pay

## 2024-01-28 ENCOUNTER — Other Ambulatory Visit: Payer: Self-pay

## 2024-01-29 ENCOUNTER — Other Ambulatory Visit: Payer: Self-pay

## 2024-01-29 ENCOUNTER — Other Ambulatory Visit (HOSPITAL_COMMUNITY): Payer: Self-pay

## 2024-01-29 NOTE — Progress Notes (Signed)
 Specialty Pharmacy Refill Coordination Note  Zachary [name] is a 43 y.o. male contacted today regarding refills of specialty medication(s) No data recorded  Patient requested (Patient-Rptd) Delivery   Delivery date: (Patient-Rptd) 02/03/24   Verified address: (Patient-Rptd) [address] [place], [place] [address]   Medication will be filled on 02/02/24.

## 2024-01-30 DIAGNOSIS — Z419 Encounter for procedure for purposes other than remedying health state, unspecified: Secondary | ICD-10-CM | POA: Diagnosis not present

## 2024-02-20 ENCOUNTER — Encounter: Payer: Self-pay | Admitting: Family Medicine

## 2024-02-23 ENCOUNTER — Other Ambulatory Visit: Payer: Self-pay

## 2024-02-24 ENCOUNTER — Other Ambulatory Visit: Payer: Self-pay

## 2024-02-24 NOTE — Progress Notes (Signed)
 Specialty Pharmacy Refill Coordination Note  Zachary [name] is a 43 y.o. male contacted today regarding refills of specialty medication(s) Upadacitinib (Rinvoq)   Patient requested (Patient-Rptd) Delivery   Delivery date: 03/01/24   Verified address: (Patient-Rptd) [address] [place], [place]   Medication will be filled on 02/29/2024

## 2024-02-26 ENCOUNTER — Ambulatory Visit: Payer: 59 | Admitting: Physician Assistant

## 2024-03-08 ENCOUNTER — Ambulatory Visit
Admission: RE | Admit: 2024-03-08 | Discharge: 2024-03-08 | Disposition: A | Discharge status: Home / Self Care | Source: Ambulatory Visit | Attending: Internal Medicine | Admitting: Internal Medicine

## 2024-03-08 ENCOUNTER — Other Ambulatory Visit (HOSPITAL_COMMUNITY): Payer: Self-pay

## 2024-03-08 VITALS — BP 148/92 | HR 116 | Temp 98.1°F | Resp 18

## 2024-03-08 DIAGNOSIS — H10021 Other mucopurulent conjunctivitis, right eye: Secondary | ICD-10-CM | POA: Diagnosis not present

## 2024-03-08 MED ORDER — ERYTHROMYCIN 5 MG/GM OP OINT
TOPICAL_OINTMENT | OPHTHALMIC | 0 refills | Status: DC
Start: 2024-03-08 — End: 2024-05-09
  Filled 2024-03-08: qty 3.5, 30d supply, fill #0

## 2024-03-08 NOTE — ED Provider Notes (Signed)
 [name]    CSN: [id number] Arrival date & time: 03/08/24  1123      History   Chief Complaint Chief Complaint  Patient presents with   Eye Problem    Entered by patient    HPI Zachary [name] is a 43 y.o. male.   Zachary [name] is a 43 y.o. male presenting for chief complaint of right eye redness, swelling, and drainage from the eye that started approximately 1 week ago. Drainage from the right eye is crusty and he states he wakes up with his eye "swollen shut". Reports "fuzzy" vision from the right eye and attributes this to the copious purulent/crusty drainage. Denies curtain sensation and spots in vision. Denies light sensitivity, contact lens use, fever, chills, nausea, vomiting, and trauma/injuries to the eye. Eye feels "sore" without overt pain. No recent sick contacts with similar symptoms. He has not attempted use of any OTC medications to help with symptoms PTA. Wears glasses for vision correction.    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 Authorizing Provider  erythromycin ophthalmic ointment Place a 1/2 inch ribbon of ointment into the lower eyelid. 03/08/24  Yes Carlisle Beers, FNP  amLODipine (NORVASC) 10 MG tablet Take 1 tablet (10 mg total) by mouth daily. 06/18/23   Clayborne Dana, NP  isoniazid (NYDRAZID) 300 MG tablet Take 1 tablet (300 mg total) by mouth daily. 04/18/22   Comer, Belia Heman, MD   lisinopril (ZESTRIL) 40 MG tablet Take 1.5 tablets (60 mg total) by mouth daily at 12 noon. 06/18/23   Clayborne Dana, NP  mesalamine (CANASA) 1000 MG suppository Place 1 suppository (1,000 mg total) rectally at bedtime. 07/24/23   Imogene Burn, MD  Multiple Vitamin (MULTI-VITAMIN) tablet Take 1 tablet by mouth daily.    [provider]  pyridOXINE (B-6) 50 MG tablet Take 1 tablet (50 mg total) by mouth daily. 04/18/22   Gardiner Barefoot, MD  sertraline (ZOLOFT) 100 MG tablet Take 2 tablets (200 mg total) by mouth daily. 11/06/23   Clayborne Dana, NP  Upadacitinib ER (RINVOQ) 30 MG TB24 Take 1 tablet (30 mg total) by mouth daily. 12/30/23   Imogene Burn, MD    Family History Family History  Problem Relation Age of Onset   COPD Mother    Healthy Father    Ovarian cancer Paternal Aunt    Colon cancer Cousin    Colon polyps Neg Hx     Social History Social History   Tobacco Use   Smoking status: Never    Passive exposure: Past   Smokeless tobacco: Current    Types: Snuff   Tobacco comments:    States trying to stop  Vaping Use   Vaping status: Never Used  Substance Use Topics   Alcohol use: Yes    Comment: occasionally   Drug use: Never     Allergies  Patient has no known allergies.   Review of Systems Review of Systems Per HPI  Physical Exam Triage Vital Signs ED Triage Vitals  Encounter Vitals Group     BP 03/08/24 1154 (!) 148/92     Systolic BP Percentile --      Diastolic BP Percentile --      Pulse Rate 03/08/24 1154 (!) 116     Resp 03/08/24 1154 18     Temp 03/08/24 1154 98.1 F (36.7 C)     Temp Source 03/08/24 1154 Oral     SpO2 03/08/24 1154 97 %     Weight --      Height --      Head Circumference --      Peak Flow --      Pain Score 03/08/24 1159 7     Pain Loc --      Pain Education --      Exclude from Growth Chart --    No data found.  Updated Vital Signs BP (!) 148/92 (BP Location: Right Arm)   Pulse (!) 116   Temp 98.1  F (36.7 C) (Oral)   Resp 18   SpO2 97%   Visual Acuity Right Eye Distance: 20/25 (Has glasses but not wearing) Left Eye Distance: 20/25 (Has glasses but not wearing) Bilateral Distance: 20/20 (Has glasses but not wearing)  Right Eye Near:   Left Eye Near:    Bilateral Near:     Physical Exam Vitals and nursing note reviewed.  Constitutional:      Appearance: He is not ill-appearing or toxic-appearing.  HENT:     Head: Normocephalic and atraumatic.     Right Ear: Hearing, tympanic membrane, ear canal and external ear normal.     Left Ear: Hearing, tympanic membrane, ear canal and external ear normal.     Nose: Nose normal.     Mouth/Throat:     Lips: Pink.     Mouth: Mucous membranes are moist. No injury or oral lesions.     Dentition: Normal dentition.     Tongue: No lesions.     Pharynx: Oropharynx is clear. Uvula midline. No pharyngeal swelling, oropharyngeal exudate, posterior oropharyngeal erythema, uvula swelling or postnasal drip.     Tonsils: No tonsillar exudate.  Eyes:     General: Lids are normal. Lids are everted, no foreign bodies appreciated. Vision grossly intact. Gaze aligned appropriately. No visual field deficit or scleral icterus.       Right eye: Discharge present. No foreign body or hordeolum.     Extraocular Movements: Extraocular movements intact.     Conjunctiva/sclera:     Right eye: Right conjunctiva is injected. No chemosis or hemorrhage.    Pupils: Pupils are equal, round, and reactive to light.     Visual Fields: Right eye visual fields normal and left eye visual fields normal.     Comments: Purulent and crusty drainage from the right eye on exam. EOMs intact without pain or dizziness elicited.  No preseptal erythema or swelling on exam.   Neck:     Trachea: Trachea and phonation normal.  Pulmonary:     Effort: Pulmonary effort is normal.  Musculoskeletal:     Cervical back: Neck supple.  Lymphadenopathy:     Cervical: No cervical adenopathy.   Skin:    General: Skin is warm and dry.     Capillary Refill: Capillary refill takes less than 2 seconds.     Findings: No rash.  Neurological:  General: No focal deficit present.     Mental Status: He is alert and oriented to person, place, and time. Mental status is at baseline.     Cranial Nerves: No dysarthria or facial asymmetry.  Psychiatric:        Mood and Affect: Mood normal.        Speech: Speech normal.        Behavior: Behavior normal.        Thought Content: Thought content normal.        Judgment: Judgment normal.      UC Treatments / Results  Labs (all labs ordered are listed, but only abnormal results are displayed) Labs Reviewed - No data to display  EKG   Radiology No results found.  Procedures Procedures (including critical care time)  Medications Ordered in UC Medications - No data to display  Initial Impression / Assessment and Plan / UC Course  I have reviewed the triage vital signs and the nursing notes.  Pertinent labs & imaging results that were available during my care of the patient were reviewed by me and considered in my medical decision making (see chart for details).   1. Mucopurulent conjunctivitis of right eye Presentation consistent with acute bacterial conjunctivitis.  HEENT exam stable, low suspicion for ocular emergency.  Low suspicion for orbital cellulitis.  Erythromycin eye ointment as prescribed for the next 7 days.  Warm compress recommended frequently.  Over the counter medications as needed for aches/pains. Hand hygiene discussed to prevent spread of infection to others.  Advised to change pillowcase after 2 to 3 days of antibiotics to avoid reinfection.    Counseled patient on potential for adverse effects with medications prescribed/recommended today, strict ER and return-to-clinic precautions discussed, patient verbalized understanding.    Final Clinical Impressions(s) / UC Diagnoses   Final diagnoses:   Mucopurulent conjunctivitis of right eye     Discharge Instructions      You have bacterial conjunctivitis (pink eye) which is an eye infection.    - Use antibiotic eye medication sent to pharmacy as directed.  - Change your pillowcase after 2 to 3 days to avoid reinfection.  - You may take Tylenol every 6 hours as needed for any pain you may have.  - Avoid scratching your eye.  Wash your hands frequently to avoid spread of infection to others.  Perform warm compresses to your eye before applying the eye medication.  If you wear contacts, do not use contacts for 14 days. Instead, use your eye glasses for vision correction. Follow-up with eye doctor as needed for new or worsening symptoms.   If you develop any new or worsening symptoms or do not improve in the next 2 to 3 days, please return.  If your symptoms are severe, please go to the emergency room.  Follow-up with your primary care provider for further evaluation and management of your symptoms as well as ongoing wellness visits.  I hope you feel better!      ED Prescriptions     Medication Sig Dispense Auth. Provider   erythromycin ophthalmic ointment Place a 1/2 inch ribbon of ointment into the lower eyelid. 3.5 g Carlisle Beers, FNP      PDMP not reviewed this encounter.   Carlisle Beers, [place] 03/09/24 1215

## 2024-03-08 NOTE — ED Triage Notes (Signed)
 Pt c/o right eye swelling, drainage, and redness for 1 week.

## 2024-03-08 NOTE — Discharge Instructions (Signed)
 You have bacterial conjunctivitis (pink eye) which is an eye infection.    - Use antibiotic eye medication sent to pharmacy as directed.  - Change your pillowcase after 2 to 3 days to avoid reinfection.  - You may take Tylenol every 6 hours as needed for any pain you may have.  - Avoid scratching your eye.  Wash your hands frequently to avoid spread of infection to others.  Perform warm compresses to your eye before applying the eye medication.  If you wear contacts, do not use contacts for 14 days. Instead, use your eye glasses for vision correction. Follow-up with eye doctor as needed for new or worsening symptoms.   If you develop any new or worsening symptoms or do not improve in the next 2 to 3 days, please return.  If your symptoms are severe, please go to the emergency room.  Follow-up with your primary care provider for further evaluation and management of your symptoms as well as ongoing wellness visits.  I hope you feel better!

## 2024-03-11 ENCOUNTER — Other Ambulatory Visit: Payer: Self-pay | Admitting: Family Medicine

## 2024-03-11 ENCOUNTER — Other Ambulatory Visit: Payer: Self-pay

## 2024-03-12 DIAGNOSIS — Z419 Encounter for procedure for purposes other than remedying health state, unspecified: Secondary | ICD-10-CM | POA: Diagnosis not present

## 2024-03-22 ENCOUNTER — Other Ambulatory Visit: Payer: Self-pay

## 2024-03-22 ENCOUNTER — Other Ambulatory Visit: Payer: Self-pay | Admitting: Pharmacy Technician

## 2024-03-22 NOTE — Progress Notes (Signed)
 Specialty Pharmacy Refill Coordination Note  Zachary [name] is a 43 y.o. male contacted today regarding refills of specialty medication(s) Upadacitinib  (Rinvoq )   Patient requested (Patient-Rptd) Delivery   Delivery date: (Patient-Rptd) 03/25/24   Verified address: (Patient-Rptd) [address], [place], [place] [address]   Medication will be filled on 03/24/24.

## 2024-03-31 ENCOUNTER — Ambulatory Visit: Admitting: Physician Assistant

## 2024-04-04 ENCOUNTER — Other Ambulatory Visit: Payer: Self-pay

## 2024-04-05 ENCOUNTER — Other Ambulatory Visit: Payer: Self-pay

## 2024-04-11 DIAGNOSIS — Z419 Encounter for procedure for purposes other than remedying health state, unspecified: Secondary | ICD-10-CM | POA: Diagnosis not present

## 2024-04-18 ENCOUNTER — Other Ambulatory Visit: Payer: Self-pay

## 2024-04-20 ENCOUNTER — Ambulatory Visit: Admitting: Physician Assistant

## 2024-04-20 ENCOUNTER — Other Ambulatory Visit: Payer: Self-pay | Admitting: Pharmacy Technician

## 2024-04-20 ENCOUNTER — Other Ambulatory Visit: Payer: Self-pay

## 2024-04-20 NOTE — Progress Notes (Signed)
 Specialty Pharmacy Refill Coordination Note  Zachary [name] is a 43 y.o. male contacted today regarding refills of specialty medication(s) Upadacitinib  (Rinvoq )   Patient requested Delivery   Delivery date: 04/27/24   Verified address: [address]  [place]   Medication will be filled on 04/26/24.

## 2024-04-20 NOTE — Progress Notes (Signed)
 Specialty Pharmacy Ongoing Clinical Assessment Note  Zachary [name] is a 43 y.o. male who is being followed by the specialty pharmacy service for RxSp Ulcerative Colitis   Patient's specialty medication(s) reviewed today: Upadacitinib  (Rinvoq )   Missed doses in the last 4 weeks: 0   Patient/Caregiver did not have any additional questions or concerns.   Therapeutic benefit summary: Patient is achieving benefit   Adverse events/side effects summary: No adverse events/side effects   Patient's therapy is appropriate to: Continue    Goals Addressed             This Visit's Progress    Maintain optimal adherence to therapy   On track    Patient is on track. Patient will maintain adherence.          Follow up: 6 months  [facility]

## 2024-04-29 ENCOUNTER — Telehealth: Payer: Self-pay | Admitting: Pharmacist

## 2024-04-29 NOTE — Telephone Encounter (Signed)
 Called patient to schedule an appointment for the [facility] [employer] Specialty Medication [facility]. I was unable to reach the patient so I left a HIPAA-compliant message requesting that the patient return my call.   Butch Penny, PharmD, [place]y [place], CPP Clinical Pharmacist [facility] [phone]

## 2024-05-09 ENCOUNTER — Encounter: Payer: Self-pay | Admitting: Gastroenterology

## 2024-05-09 ENCOUNTER — Other Ambulatory Visit: Payer: Self-pay

## 2024-05-09 ENCOUNTER — Ambulatory Visit (INDEPENDENT_AMBULATORY_CARE_PROVIDER_SITE_OTHER): Admitting: Gastroenterology

## 2024-05-09 VITALS — BP 142/88 | HR 85 | Ht 72.0 in | Wt 230.0 lb

## 2024-05-09 DIAGNOSIS — Z23 Encounter for immunization: Secondary | ICD-10-CM

## 2024-05-09 DIAGNOSIS — K5190 Ulcerative colitis, unspecified, without complications: Secondary | ICD-10-CM | POA: Diagnosis not present

## 2024-05-09 DIAGNOSIS — K51919 Ulcerative colitis, unspecified with unspecified complications: Secondary | ICD-10-CM

## 2024-05-09 MED ORDER — RINVOQ 30 MG PO TB24
30.0000 mg | ORAL_TABLET | Freq: Every day | ORAL | 5 refills | Status: DC
  Filled 2024-05-09 – 2024-05-19 (×2): qty 30, 30d supply, fill #0
  Filled 2024-06-16 – 2024-06-21 (×3): qty 30, 30d supply, fill #1
  Filled 2024-07-13 (×2): qty 30, 30d supply, fill #2
  Filled 2024-08-16: qty 30, 30d supply, fill #3
  Filled 2024-09-12: qty 30, 30d supply, fill #4
  Filled 2024-10-06: qty 30, 30d supply, fill #5

## 2024-05-09 NOTE — Progress Notes (Signed)
 I agree with the assessment and plan as outlined by Zachary [name].

## 2024-05-09 NOTE — Progress Notes (Signed)
 Chief Complaint: follow-up Ulcerative colitis Primary GI Doctor:Dr. Rosaline Coma  HPI:43 year old male presents for follow up of ulcerative colitis. Last seen by Dr. Rosaline Coma on 04/23/23 for routine follow-up.  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   03/2022 Patient seen by ID and treated for latent Tb with INH 300 mg daily + B6 50 mcg daily.   03/08/24 seen in ED for bacterial conjunctivitis, treated with erythromycin .    Interval History    Patient presents for follow-up on Ulcerative colitis. Dr. Rosaline Coma increased the Rinvoq  from 15 mg to 30 mg po daily end of January due to some active inflammation in the left colon and rectum on recent colonoscopy . Patient reports he has been doing well on Rinvoq  and the best he has felt in long time. He reports he had episode few weeks where he had some stool frequency for a few days that resolved on own. He thinks it Marvin Grabill have been related to something he had to eat. No blood in stool.      He also has lost about 18lbs in the past year. He reports he has been more active with playing Frisbee golf and yard work. He typically only eats once per day. He tells me he has been working from home.     Patient notes some visual changes as of recent, it has been quite some time since he had eye exam.    Patient reports he completed the latent TB treatment which was a 9 month regimen, but did not know how to follow-up on it. He has not been back to see ID.   IBD CHARACTERISTICS: Diagnosis: Ulcerative colitis Year of diagnosis: 2019 Location: Pancolitis EIM: No Prior surgeries: No Prior medications: Mesalamine , Humira (developed antibodies), budesonide  (on-and-off), Entyvio  (12/2019-03/2022, never achieved clinical remission), Rinvoq  (05/2022-current) Last flare: 12/2019  Wt Readings from Last 3 Encounters:  05/09/24 230 lb (104.3 kg)  07/24/23 248 lb (112.5 kg)  04/23/23 248 lb (112.5 kg)     Past Medical History:  Diagnosis Date   Hypertension     Ulcerative colitis (HCC)    Ulcerative colitis (HCC) 02/12/2022    Past Surgical History:  Procedure Laterality Date   COLONOSCOPY      Current Outpatient Medications  Medication Sig Dispense Refill   amLODipine  (NORVASC ) 10 MG tablet Take 1 tablet (10 mg total) by mouth daily. 90 tablet 1   isoniazid  (NYDRAZID ) 300 MG tablet Take 1 tablet (300 mg total) by mouth daily. 30 tablet 8   lisinopril  (ZESTRIL ) 40 MG tablet Take 1.5 tablets (60 mg total) by mouth daily at 12 noon. 135 tablet 1   Multiple Vitamin (MULTI-VITAMIN) tablet Take 1 tablet by mouth daily.     pyridOXINE  (B-6) 50 MG tablet Take 1 tablet (50 mg total) by mouth daily. 30 tablet 8   sertraline  (ZOLOFT ) 100 MG tablet Take 2 tablets (200 mg total) by mouth daily. 60 tablet 0   Upadacitinib  ER (RINVOQ ) 30 MG TB24 Take 1 tablet (30 mg total) by mouth daily. 30 tablet 5   No current facility-administered medications for this visit.    Allergies as of 05/09/2024   (No Known Allergies)    Family History  Problem Relation Age of Onset   COPD Mother    Healthy Father    Ovarian cancer Paternal Aunt    Colon cancer Cousin    Colon polyps Neg Hx     Review of Systems:    Constitutional: No weight loss,  fever, chills, weakness or fatigue HEENT: Eyes: No change in vision               Ears, Nose, Throat:  No change in hearing or congestion Skin: No rash or itching Cardiovascular: No chest pain, chest pressure or palpitations   Respiratory: No SOB or cough Gastrointestinal: See HPI and otherwise negative Genitourinary: No dysuria or change in urinary frequency Neurological: No headache, dizziness or syncope Musculoskeletal: No new muscle or joint pain Hematologic: No bleeding or bruising Psychiatric: No history of depression or anxiety    Physical Exam:  Vital signs: BP (!) 142/88   Pulse 85   Ht 6' (1.829 m)   Wt 230 lb (104.3 kg)   BMI 31.19 kg/m   Constitutional:   Pleasant  male appears to be in NAD,  Well developed, Well nourished, alert and cooperative Throat: Oral cavity and pharynx without inflammation, swelling or lesion.  Respiratory: Respirations even and unlabored. Lungs clear to auscultation bilaterally.   No wheezes, crackles, or rhonchi.  Cardiovascular: Normal S1, S2. Regular rate and rhythm. No peripheral edema, cyanosis or pallor.  Gastrointestinal:  Soft, nondistended, nontender. No rebound or guarding. Normal bowel sounds. No appreciable masses or hepatomegaly. Rectal:  Not performed.  Msk:  Symmetrical without gross deformities. Without edema, no deformity or joint abnormality.  Neurologic:  Alert and  oriented x4;  grossly normal neurologically.  Skin:   Dry and intact without significant lesions or rashes. Psychiatric: Oriented to person, place and time. Demonstrates good judgement and reason without abnormal affect or behaviors.  RELEVANT LABS AND IMAGING: CBC    Latest Ref Rng & Units 01/05/2023    2:01 PM 04/16/2022   10:39 AM 03/20/2022    4:13 PM  CBC  WBC 4.0 - 10.5 K/uL 5.3  8.9  11.6   Hemoglobin 13.0 - 17.0 g/dL 10.2  72.5  36.6   Hematocrit 39.0 - 52.0 % 38.1  42.3  41.2   Platelets 150.0 - 400.0 K/uL 351.0  326.0  324.0      CMP     Latest Ref Rng & Units 01/05/2023    2:01 PM 04/16/2022   10:39 AM 03/20/2022    4:13 PM  CMP  Glucose 70 - 99 mg/dL 440  347  80   BUN 6 - 23 mg/dL 9  11  26    Creatinine 0.40 - 1.50 mg/dL 4.25  9.56  3.87   Sodium 135 - 145 mEq/L 138  139  139   Potassium 3.5 - 5.1 mEq/L 3.8  3.4  3.5   Chloride 96 - 112 mEq/L 101  104  103   CO2 19 - 32 mEq/L 25  26  24    Calcium 8.4 - 10.5 mg/dL 9.0  9.4  9.3   Total Protein 6.0 - 8.3 g/dL 7.1  7.5  7.2   Total Bilirubin 0.2 - 1.2 mg/dL 0.5  0.4  0.5   Alkaline Phos 39 - 117 U/L 69  49  50   AST 0 - 37 U/L 50  15  29   ALT 0 - 53 U/L 49  16  33      Lab Results  Component Value Date   TSH 1.15 03/20/2022  Labs 01/2022: Quant gold positive.   Labs 03/2022: CBC with elevated WBC  of 11.6. CMP unremarkable. TSH nml   Labs 03/2022: CRP nml.    Labs 01/2023: CBC with low Hb of 12.8. CMP with mildly elevated AST of  50. HbA1C 5.9%. Lipid panel with elevated triglycerides of 341.   Labs pending annual tomorrow  GI procedures: Colonoscopy April 2019 they reported colitis with biopsy showing moderate to severe chronic active colitis with ulceration.  They describe crypt distortion associated with increased lymphocytes, plasma cells, and lymphoid aggregates within the lamina propria. severely active chronic colitis from the rectum to the transverse colon  Colonoscopy from January 21 for evaluation of ulcerative colitis showed colitis with random biopsy showing diffuse colitis with neutrophilic cryptitis, distortion of crypt architecture, moderate mononuclear cell expansion of the lamina propria and basal plasmacytosis consistent with moderately active ulcerative colitis.  No dysplasia present.    Colonoscopy 07/2023- Impression: - The examined portion of the ileum was normal. - Six 4 to 12 mm polyps in the transverse colon and in the ascending colon, removed with a cold snare. Resected and retrieved. - Inflammation was found from the rectum to the cecum. This was mild in severity, improved compared to previous examinations. Biopsied. - One 7 mm polyp in the descending colon, removed with a cold snare. Resected and retrieved.  Assessment: Encounter Diagnosis  Name Primary?   Ulcerative colitis without complications, unspecified location [facility]) Yes    43 year old male patient who presents for follow-up on Ulcerative colitis. Patient has responded well to Rinvoq  30 mg po daily. Follow-up lab work ordered for tomorrow with his PCP to evaluate lipid panel and liver panel. He was due for the PCV20 vaccine and received today. Also referred him to ophthalmology for eye exam. Will have patient follow-up in 6 mths and prn.  Plan: -Get PCV20 vaccination today -pending lab work tomorrow   -Recommend annual dermatology appointment - referral to ophthalmologist placed -Continue Rinvoq  30mg  po daily . Refill  -Follow-up with Dr. Rosaline Coma in 6 mths   Thank you for the courtesy of this consult. Please call me with any questions or concerns.   Virgen Belland, FNP-C [facility] 05/09/2024, 3:59 PM  Cc: Everlina Hock, NP

## 2024-05-09 NOTE — Patient Instructions (Signed)
 _______________________________________________________  If your blood pressure at your visit was 140/90 or greater, please contact your primary care physician to follow up on this.  _______________________________________________________  If you are age 45 or older, your body mass index should be between 23-30. Your Body mass index is 31.19 kg/m. If this is out of the aforementioned range listed, please consider follow up with your Primary Care Provider.  If you are age 85 or younger, your body mass index should be between 19-25. Your Body mass index is 31.19 kg/m. If this is out of the aformentioned range listed, please consider follow up with your Primary Care Provider.   ________________________________________________________  The [facility] providers would like to encourage you to use MYCHART to communicate with providers for non-urgent requests or questions.  Due to long hold times on the telephone, sending your provider a message by [facility] may be a faster and more efficient way to get a response.  Please allow 48 business hours for a response.  Please remember that this is for non-urgent requests.  _______________________________________________________   Thank you for trusting me with your gastrointestinal care. Deanna May, RNP

## 2024-05-10 ENCOUNTER — Encounter (HOSPITAL_COMMUNITY): Payer: Self-pay

## 2024-05-10 ENCOUNTER — Other Ambulatory Visit (HOSPITAL_COMMUNITY): Payer: Self-pay

## 2024-05-10 ENCOUNTER — Ambulatory Visit (INDEPENDENT_AMBULATORY_CARE_PROVIDER_SITE_OTHER): Admitting: Family Medicine

## 2024-05-10 ENCOUNTER — Other Ambulatory Visit: Payer: Self-pay

## 2024-05-10 ENCOUNTER — Encounter: Payer: Self-pay | Admitting: Family Medicine

## 2024-05-10 VITALS — BP 113/75 | HR 95 | Ht 72.0 in | Wt 233.0 lb

## 2024-05-10 DIAGNOSIS — F419 Anxiety disorder, unspecified: Secondary | ICD-10-CM | POA: Diagnosis not present

## 2024-05-10 DIAGNOSIS — Z0000 Encounter for general adult medical examination without abnormal findings: Secondary | ICD-10-CM

## 2024-05-10 DIAGNOSIS — R739 Hyperglycemia, unspecified: Secondary | ICD-10-CM

## 2024-05-10 DIAGNOSIS — I10 Essential (primary) hypertension: Secondary | ICD-10-CM

## 2024-05-10 DIAGNOSIS — F32A Depression, unspecified: Secondary | ICD-10-CM

## 2024-05-10 LAB — COMPREHENSIVE METABOLIC PANEL WITH GFR
ALT: 51 U/L (ref 0–53)
AST: 84 U/L — ABNORMAL HIGH (ref 0–37)
Albumin: 4.3 g/dL (ref 3.5–5.2)
Alkaline Phosphatase: 88 U/L (ref 39–117)
BUN: 18 mg/dL (ref 6–23)
CO2: 28 meq/L (ref 19–32)
Calcium: 9.5 mg/dL (ref 8.4–10.5)
Chloride: 97 meq/L (ref 96–112)
Creatinine, Ser: 1.2 mg/dL (ref 0.40–1.50)
GFR: 73.49 mL/min (ref >60.00)
Glucose, Bld: 76 mg/dL (ref 70–99)
Potassium: 3.4 meq/L — ABNORMAL LOW (ref 3.5–5.1)
Sodium: 137 meq/L (ref 135–145)
Total Bilirubin: 0.6 mg/dL (ref 0.2–1.2)
Total Protein: 7.5 g/dL (ref 6.0–8.3)

## 2024-05-10 LAB — CBC WITH DIFFERENTIAL/PLATELET
Basophils Absolute: 0 10*3/uL (ref 0.0–0.1)
Basophils Relative: 0.5 % (ref 0.0–3.0)
Eosinophils Absolute: 0.1 10*3/uL (ref 0.0–0.7)
Eosinophils Relative: 1.2 % (ref 0.0–5.0)
HCT: 35.1 % — ABNORMAL LOW (ref 39.0–52.0)
Hemoglobin: 11.8 g/dL — ABNORMAL LOW (ref 13.0–17.0)
Lymphocytes Relative: 23.8 % (ref 12.0–46.0)
Lymphs Abs: 1.9 10*3/uL (ref 0.7–4.0)
MCHC: 33.5 g/dL (ref 30.0–36.0)
MCV: 97.1 fl (ref 78.0–100.0)
Monocytes Absolute: 0.8 10*3/uL (ref 0.1–1.0)
Monocytes Relative: 9.5 % (ref 3.0–12.0)
Neutro Abs: 5.1 10*3/uL (ref 1.4–7.7)
Neutrophils Relative %: 65 % (ref 43.0–77.0)
Platelets: 272 10*3/uL (ref 150.0–400.0)
RBC: 3.62 Mil/uL — ABNORMAL LOW (ref 4.22–5.81)
RDW: 18.5 % — ABNORMAL HIGH (ref 11.5–15.5)
WBC: 7.9 10*3/uL (ref 4.0–10.5)

## 2024-05-10 LAB — LIPID PANEL
Cholesterol: 200 mg/dL (ref 0–200)
HDL: 73.1 mg/dL (ref >39.00)
LDL Cholesterol: 99 mg/dL (ref 0–99)
NonHDL: 126.68
Total CHOL/HDL Ratio: 3
Triglycerides: 136 mg/dL (ref 0.0–149.0)
VLDL: 27.2 mg/dL (ref 0.0–40.0)

## 2024-05-10 MED ORDER — AMLODIPINE BESYLATE 10 MG PO TABS
10.0000 mg | ORAL_TABLET | Freq: Every day | ORAL | 1 refills | Status: DC
Start: 2024-05-10 — End: 2024-10-19
  Filled 2024-05-10: qty 90, 90d supply, fill #0
  Filled 2024-06-02 – 2024-08-03 (×2): qty 90, 90d supply, fill #1

## 2024-05-10 MED ORDER — SERTRALINE HCL 100 MG PO TABS
200.0000 mg | ORAL_TABLET | Freq: Every day | ORAL | 0 refills | Status: DC
  Filled 2024-05-10: qty 60, 30d supply, fill #0

## 2024-05-10 NOTE — Progress Notes (Signed)
 Complete physical exam  Patient: Zachary [name]   DOB: January 21, 1981   [name]  MRN: [id number]  Subjective:     Chief Complaint  Patient presents with   Medical Management of Chronic Issues    Zachary [name] is a 43 y.o. male who presents today for a complete physical exam. He reports consuming a general diet. Home exercise routine includes walking, golf. He generally feels well. He reports sleeping well. He does not have additional problems to discuss today.   Currently lives with: alone Acute concerns or interim problems since last visit: no  Vision concerns: mild changes, wears glasses - pt to schedule eye exam Dental concerns: no STD concerns: no  ETOH use: occasional Nicotine use: no Recreational drugs/illegal substances: no   Hypertension: - Medications: amlodipine  10 mg daily - Compliance: good, ran out of lisinopril  a few months ago - Checking BP at home: no - Denies any SOB, recurrent headaches, CP, vision changes, LE edema, dizziness, palpitations, or medication side effects. - Diet: trying to heart healthy - Exercise: most day, walking, golf   Mood follow-up: - Diagnosis: anxiety and depression - Treatment: sertraline  200 mg daily - Medication side effects: none - SI/HI: none - Update: Doing well, no new concerns.    Most recent fall risk assessment:    05/10/2024   12:05 PM  Fall Risk   Falls in the past year? 0  Number falls in past yr: 0  Injury with Fall? 0  Risk for fall due to : No Fall Risks  Follow up Falls evaluation completed     Most recent depression screenings:    05/10/2024   12:17 PM 04/18/2022    2:28 PM  PHQ 2/9 Scores  PHQ - 2 Score 4 0  PHQ- 9 Score 12             Patient Care Team: Everlina Hock, NP as PCP - General (Family Medicine)   Outpatient Medications Prior to Visit  Medication Sig Note   isoniazid  (NYDRAZID ) 300 MG tablet Take 1 tablet (300 mg total) by mouth daily.    Multiple Vitamin  (MULTI-VITAMIN) tablet Take 1 tablet by mouth daily.    pyridOXINE  (B-6) 50 MG tablet Take 1 tablet (50 mg total) by mouth daily.    Upadacitinib  ER (RINVOQ ) 30 MG TB24 Take 1 tablet (30 mg total) by mouth daily.    [DISCONTINUED] amLODipine  (NORVASC ) 10 MG tablet Take 1 tablet (10 mg total) by mouth daily.    [DISCONTINUED] sertraline  (ZOLOFT ) 100 MG tablet Take 2 tablets (200 mg total) by mouth daily.    [DISCONTINUED] lisinopril  (ZESTRIL ) 40 MG tablet Take 1.5 tablets (60 mg total) by mouth daily at 12 noon. (Patient not taking: Reported on 05/10/2024) 05/10/2024: BP improved without it   No facility-administered medications prior to visit.    ROS All review of systems negative except what is listed in the HPI        Objective:     BP 113/75   Pulse 95   Ht 6' (1.829 m)   Wt 233 lb (105.7 kg)   SpO2 97%   BMI 31.60 kg/m    Physical Exam Vitals reviewed.  Constitutional:      General: He is not in acute distress.    Appearance: Normal appearance. He is not ill-appearing.  HENT:     Head: Normocephalic and atraumatic.     Right Ear: Tympanic membrane normal.     Left Ear: Tympanic membrane  normal.     Nose: Nose normal.     Mouth/Throat:     Mouth: Mucous membranes are moist.     Pharynx: Oropharynx is clear.  Eyes:     Extraocular Movements: Extraocular movements intact.     Conjunctiva/sclera: Conjunctivae normal.     Pupils: Pupils are equal, round, and reactive to light.  Cardiovascular:     Rate and Rhythm: Normal rate and regular rhythm.     Pulses: Normal pulses.     Heart sounds: Normal heart sounds.  Pulmonary:     Effort: Pulmonary effort is normal.     Breath sounds: Normal breath sounds.  Abdominal:     General: Abdomen is flat. Bowel sounds are normal. There is no distension.     Palpations: Abdomen is soft. There is no mass.     Tenderness: There is no abdominal tenderness. There is no guarding or rebound.  Genitourinary:    Comments: Deferred  exam Musculoskeletal:        General: Normal range of motion.     Cervical back: Normal range of motion and neck supple. No tenderness.     Right lower leg: No edema.     Left lower leg: No edema.  Lymphadenopathy:     Cervical: No cervical adenopathy.  Skin:    General: Skin is warm and dry.     Capillary Refill: Capillary refill takes less than 2 seconds.  Neurological:     General: No focal deficit present.     Mental Status: He is alert and oriented to person, place, and time. Mental status is at baseline.  Psychiatric:        Mood and Affect: Mood normal.        Behavior: Behavior normal.        Thought Content: Thought content normal.        Judgment: Judgment normal.      No results found for any visits on 05/10/24.     Assessment & Plan:    Routine Health Maintenance and Physical Exam Discussed health promotion and safety including diet and exercise recommendations, dental health, and injury prevention. Tobacco cessation if applicable. Seat belts, sunscreen, smoke detectors, etc.    Immunization History  Administered Date(s) Administered   Hepb-cpg 04/25/2022, 06/05/2022   Influenza,inj,Quad PF,6+ Mos 08/23/2019, 09/24/2020, 08/26/2021, 01/05/2023   PNEUMOCOCCAL CONJUGATE-20 05/09/2024   Pneumococcal Conjugate-13 04/23/2023   Zoster Recombinant(Shingrix ) 05/19/2022    Health Maintenance  Topic Date Due   HIV Screening  Never done   Hepatitis C Screening  Never done   DTaP/Tdap/Td (1 - Tdap) Never done   Colonoscopy  07/23/2024   COVID-19 Vaccine (1) 05/09/2025 (Originally 09/26/1986)   INFLUENZA VACCINE  07/01/2024   Pneumococcal Vaccine 67-42 Years old  Aged Out   HPV VACCINES  Aged Out   Meningococcal B Vaccine  Aged Out        Problem List Items Addressed This Visit       Active Problems   Anxiety and depression   Relevant Medications   sertraline  (ZOLOFT ) 100 MG tablet   Primary hypertension   Relevant Medications   amLODipine  (NORVASC ) 10  MG tablet   Other Relevant Orders   Comprehensive metabolic panel with GFR   Other Visit Diagnoses       Preventative health care    -  Primary   Relevant Orders   CBC with Differential/Platelet   Comprehensive metabolic panel with GFR   Lipid panel   TSH  Hyperglycemia       Relevant Orders   Comprehensive metabolic panel with GFR          PATIENT COUNSELING:   Recommend that most people either abstain from alcohol or drink within safe limits (<=14/week and <=4 drinks/occasion for males, <=7/weeks and <= 3 drinks/occasion for females) and that the risk for alcohol disorders and other health effects rises proportionally with the number of drinks per week and how often a drinker exceeds daily limits.  Diet: Recommend to adjust caloric intake to maintain or achieve ideal body weight, to reduce intake of dietary saturated fat and total fat, to limit sodium intake by avoiding high sodium foods and not adding table salt, and to maintain adequate dietary potassium and calcium preferably from fresh fruits, vegetables, and low-fat dairy products.   Emphasized the importance of regular exercise.  Injury prevention: Recommend seatbelts, safety helmets, smoke detector, etc..   Dental health: Recommend regular tooth brushing, flossing, and dental visits.     Return in about 6 months (around 11/09/2024) for routine follow-up.     Everlina Hock, NP

## 2024-05-12 DIAGNOSIS — Z419 Encounter for procedure for purposes other than remedying health state, unspecified: Secondary | ICD-10-CM | POA: Diagnosis not present

## 2024-05-12 LAB — TSH: TSH: 4.5 u[IU]/mL (ref 0.35–5.50)

## 2024-05-13 ENCOUNTER — Encounter: Payer: Self-pay | Admitting: Family Medicine

## 2024-05-13 ENCOUNTER — Ambulatory Visit: Payer: Self-pay | Admitting: Family Medicine

## 2024-05-13 DIAGNOSIS — R7401 Elevation of levels of liver transaminase levels: Secondary | ICD-10-CM

## 2024-05-13 DIAGNOSIS — D649 Anemia, unspecified: Secondary | ICD-10-CM

## 2024-05-19 ENCOUNTER — Other Ambulatory Visit: Payer: Self-pay

## 2024-05-19 ENCOUNTER — Encounter (INDEPENDENT_AMBULATORY_CARE_PROVIDER_SITE_OTHER): Payer: Self-pay

## 2024-05-19 NOTE — Progress Notes (Signed)
 Specialty Pharmacy Refill Coordination Note  Zachary [name] is a 43 y.o. male contacted today regarding refills of specialty medication(s) Upadacitinib  (Rinvoq )   Patient requested Delivery   Delivery date: 05/20/24   Verified address: [address]   Medication will be filled on 05/19/24.

## 2024-05-20 ENCOUNTER — Telehealth: Payer: Self-pay | Admitting: Pharmacist

## 2024-05-20 NOTE — Telephone Encounter (Signed)
 Called patient to schedule an appointment for the [facility] [employer] Specialty Medication [facility]. I was unable to reach the patient so I left a HIPAA-compliant message requesting that the patient return my call.   Butch Penny, PharmD, [place]y [place], CPP Clinical Pharmacist [facility] [phone]

## 2024-06-02 ENCOUNTER — Other Ambulatory Visit: Payer: Self-pay

## 2024-06-02 ENCOUNTER — Other Ambulatory Visit: Payer: Self-pay | Admitting: Family Medicine

## 2024-06-02 DIAGNOSIS — F32A Depression, unspecified: Secondary | ICD-10-CM

## 2024-06-02 MED ORDER — SERTRALINE HCL 100 MG PO TABS
200.0000 mg | ORAL_TABLET | Freq: Every day | ORAL | 0 refills | Status: DC
  Filled 2024-06-02: qty 180, 90d supply, fill #0

## 2024-06-06 ENCOUNTER — Other Ambulatory Visit: Payer: Self-pay

## 2024-06-11 DIAGNOSIS — Z419 Encounter for procedure for purposes other than remedying health state, unspecified: Secondary | ICD-10-CM | POA: Diagnosis not present

## 2024-06-16 ENCOUNTER — Other Ambulatory Visit: Payer: Self-pay

## 2024-06-20 ENCOUNTER — Other Ambulatory Visit (HOSPITAL_COMMUNITY): Payer: Self-pay

## 2024-06-21 ENCOUNTER — Other Ambulatory Visit: Payer: Self-pay

## 2024-06-22 ENCOUNTER — Other Ambulatory Visit: Payer: Self-pay

## 2024-06-22 NOTE — Progress Notes (Signed)
 Specialty Pharmacy Refill Coordination Note  Zachary [name] is a 43 y.o. male contacted today regarding refills of specialty medication(s) Upadacitinib  (Rinvoq )   Patient requested Delivery   Delivery date: 06/23/24   Verified address: [address]   Medication will be filled on 06/22/24.

## 2024-06-25 ENCOUNTER — Encounter: Payer: Self-pay | Admitting: Family Medicine

## 2024-07-12 ENCOUNTER — Encounter (INDEPENDENT_AMBULATORY_CARE_PROVIDER_SITE_OTHER): Payer: Self-pay

## 2024-07-12 DIAGNOSIS — Z419 Encounter for procedure for purposes other than remedying health state, unspecified: Secondary | ICD-10-CM | POA: Diagnosis not present

## 2024-07-13 ENCOUNTER — Other Ambulatory Visit (HOSPITAL_COMMUNITY): Payer: Self-pay

## 2024-07-13 ENCOUNTER — Other Ambulatory Visit: Payer: Self-pay

## 2024-07-13 NOTE — Progress Notes (Signed)
 Specialty Pharmacy Refill Coordination Note  MyChart Questionnaire Submission  Zachary [name] is a 43 y.o. male contacted today regarding refills of specialty medication(s) Rinvoq .  Doses on hand: (Patient-Rptd) 6-7 - patient should have about 12 tablets per [facility].  Patient requested: (Patient-Rptd) Delivery   Delivery date: 07/15/24  Verified address: [address] [place] [address]  Medication will be filled on 07/14/24.

## 2024-07-14 ENCOUNTER — Other Ambulatory Visit: Payer: Self-pay

## 2024-07-28 ENCOUNTER — Emergency Department (HOSPITAL_COMMUNITY)

## 2024-07-28 ENCOUNTER — Encounter (HOSPITAL_COMMUNITY): Payer: Self-pay

## 2024-07-28 ENCOUNTER — Other Ambulatory Visit: Payer: Self-pay

## 2024-07-28 ENCOUNTER — Emergency Department (HOSPITAL_COMMUNITY)
Admission: EM | Admit: 2024-07-28 | Discharge: 2024-07-28 | Disposition: A | Discharge status: Home / Self Care | Attending: Emergency Medicine | Admitting: Emergency Medicine

## 2024-07-28 DIAGNOSIS — I6789 Other cerebrovascular disease: Secondary | ICD-10-CM | POA: Diagnosis not present

## 2024-07-28 DIAGNOSIS — F10920 Alcohol use, unspecified with intoxication, uncomplicated: Secondary | ICD-10-CM | POA: Diagnosis not present

## 2024-07-28 DIAGNOSIS — R7401 Elevation of levels of liver transaminase levels: Secondary | ICD-10-CM | POA: Insufficient documentation

## 2024-07-28 DIAGNOSIS — Y908 Blood alcohol level of 240 mg/100 ml or more: Secondary | ICD-10-CM | POA: Diagnosis not present

## 2024-07-28 DIAGNOSIS — R531 Weakness: Secondary | ICD-10-CM | POA: Diagnosis not present

## 2024-07-28 DIAGNOSIS — I499 Cardiac arrhythmia, unspecified: Secondary | ICD-10-CM | POA: Diagnosis not present

## 2024-07-28 DIAGNOSIS — R4182 Altered mental status, unspecified: Secondary | ICD-10-CM | POA: Diagnosis not present

## 2024-07-28 DIAGNOSIS — Z743 Need for continuous supervision: Secondary | ICD-10-CM | POA: Diagnosis not present

## 2024-07-28 LAB — URINALYSIS, ROUTINE W REFLEX MICROSCOPIC
Bilirubin Urine: NEGATIVE
Glucose, UA: NEGATIVE mg/dL
Ketones, ur: NEGATIVE mg/dL
Leukocytes,Ua: NEGATIVE
Nitrite: NEGATIVE
Protein, ur: 100 mg/dL — AB
Specific Gravity, Urine: 1.025 (ref 1.005–1.030)
pH: 5 (ref 5.0–8.0)

## 2024-07-28 LAB — COMPREHENSIVE METABOLIC PANEL WITH GFR
ALT: 70 U/L — ABNORMAL HIGH (ref 0–44)
AST: 137 U/L — ABNORMAL HIGH (ref 15–41)
Albumin: 3.5 g/dL (ref 3.5–5.0)
Alkaline Phosphatase: 118 U/L (ref 38–126)
Anion gap: 15 (ref 5–15)
BUN: 17 mg/dL (ref 6–20)
CO2: 23 mmol/L (ref 22–32)
Calcium: 8.5 mg/dL — ABNORMAL LOW (ref 8.9–10.3)
Chloride: 105 mmol/L (ref 98–111)
Creatinine, Ser: 0.91 mg/dL (ref 0.61–1.24)
GFR, Estimated: >60 mL/min (ref >60)
Glucose, Bld: 113 mg/dL — ABNORMAL HIGH (ref 70–99)
Potassium: 3.8 mmol/L (ref 3.5–5.1)
Sodium: 143 mmol/L (ref 135–145)
Total Bilirubin: 1.5 mg/dL — ABNORMAL HIGH (ref 0.0–1.2)
Total Protein: 6.3 g/dL — ABNORMAL LOW (ref 6.5–8.1)

## 2024-07-28 LAB — CBC WITH DIFFERENTIAL/PLATELET
Abs Immature Granulocytes: 0.02 K/uL (ref 0.00–0.07)
Basophils Absolute: 0 K/uL (ref 0.0–0.1)
Basophils Relative: 1 %
Eosinophils Absolute: 0 K/uL (ref 0.0–0.5)
Eosinophils Relative: 1 %
HCT: 33.5 % — ABNORMAL LOW (ref 39.0–52.0)
Hemoglobin: 11.3 g/dL — ABNORMAL LOW (ref 13.0–17.0)
Immature Granulocytes: 0 %
Lymphocytes Relative: 28 %
Lymphs Abs: 1.5 K/uL (ref 0.7–4.0)
MCH: 33.3 pg (ref 26.0–34.0)
MCHC: 33.7 g/dL (ref 30.0–36.0)
MCV: 98.8 fL (ref 80.0–100.0)
Monocytes Absolute: 0.6 K/uL (ref 0.1–1.0)
Monocytes Relative: 12 %
Neutro Abs: 3 K/uL (ref 1.7–7.7)
Neutrophils Relative %: 58 %
Platelets: 287 K/uL (ref 150–400)
RBC: 3.39 MIL/uL — ABNORMAL LOW (ref 4.22–5.81)
RDW: 15.9 % — ABNORMAL HIGH (ref 11.5–15.5)
WBC: 5.2 K/uL (ref 4.0–10.5)
nRBC: 0 % (ref 0.0–0.2)

## 2024-07-28 LAB — ETHANOL: Alcohol, Ethyl (B): 324 mg/dL (ref <15)

## 2024-07-28 LAB — SALICYLATE LEVEL: Salicylate Lvl: <7 mg/dL — ABNORMAL LOW (ref 7.0–30.0)

## 2024-07-28 NOTE — ED Triage Notes (Signed)
 Patient BIB EMS from home stating that he felt as if he was having a stroke. Upon EMS arrival patient was able to ambulate to the truck. Patient had some slurred speech, but states that he had been drinking since noon and didn't know when he was last normal. Patient had some original left sided weakness, but then was able to bear weight to pivot in the truck. EMS states there is obvious ETOH on the breath. C/O dizziness upon standing, with EMS noting orthostatics.

## 2024-07-28 NOTE — Discharge Instructions (Signed)
 You are seen in the emergency department today for concerns of weakness.  Your labs and imaging were thankfully reassuring with no signs of a stroke but you do appear to have alcohol in her system which likely contributed some of this.  I would truly recommend that you follow-up with your primary care provider for for evaluation.  Return to the emergency department for any concerns or new or worsening symptoms.

## 2024-07-28 NOTE — ED Provider Notes (Signed)
 [place] EMERGENCY DEPARTMENT AT [facility] Provider Note   CSN: [id number] Arrival date & time: 07/28/24  1700     Patient presents with: Altered Mental Status and Alcohol Intoxication   Zachary [name] is a 43 y.o. male.  Patient with past history significant for ulcerative colitis, obesity, hypertension, anxiety depression presents to the emergency department today with concerns of possible mental status versus alcohol intoxication.  Patient reportedly was called out by EMS saying that he felt that he maybe had a stroke.  Patient was evaluated by EMS who noted that he did have some slurred speech but a strong odor of alcohol.  He reports that he drinks 3 Budweiser's at noon.  He states that he felt normal then took a nap shortly later and then woke up and felt that he had increased slurred speech and left-sided weakness but was able to perform all neurological testing by EMS without any deficits noted.  Patient does endorse some dizziness with standing but EMS also did note some orthostasis with development of tachycardia when patient tried to stand independently.    Altered Mental Status Alcohol Intoxication       Prior to Admission medications   Medication Sig Start Date End Date Taking? Authorizing Provider  amLODipine  (NORVASC ) 10 MG tablet Take 1 tablet (10 mg total) by mouth daily. 05/10/24   Almarie Waddell NOVAK, NP  isoniazid  (NYDRAZID ) 300 MG tablet Take 1 tablet (300 mg total) by mouth daily. 04/18/22   Efrain Lamar ORN, MD  Multiple Vitamin (MULTI-VITAMIN) tablet Take 1 tablet by mouth daily.    [provider]  pyridOXINE  (B-6) 50 MG tablet Take 1 tablet (50 mg total) by mouth daily. 04/18/22   Efrain Lamar ORN, MD  sertraline  (ZOLOFT ) 100 MG tablet Take 2 tablets (200 mg total) by mouth daily. 06/02/24   Almarie Waddell NOVAK, NP  Upadacitinib  ER (RINVOQ ) 30 MG TB24 Take 1 tablet (30 mg total) by mouth daily. 05/09/24   May, Deanna J, NP    Allergies: Patient has no  known allergies.    Review of Systems  Neurological:  Positive for speech difficulty.  All other systems reviewed and are negative.   Updated Vital Signs BP (!) 161/89   Pulse 95   Temp 97.8 F (36.6 C) (Oral)   Resp 16   SpO2 96%   Physical Exam Vitals and nursing note reviewed.  Constitutional:      General: He is not in acute distress.    Appearance: He is well-developed. He is not ill-appearing.     Comments: Patient has odor of alcohol.  HENT:     Head: Normocephalic and atraumatic.  Eyes:     Conjunctiva/sclera: Conjunctivae normal.  Cardiovascular:     Rate and Rhythm: Normal rate and regular rhythm.     Heart sounds: No murmur heard. Pulmonary:     Effort: Pulmonary effort is normal. No respiratory distress.     Breath sounds: Normal breath sounds.  Abdominal:     Palpations: Abdomen is soft.     Tenderness: There is no abdominal tenderness.  Musculoskeletal:        General: No swelling.     Cervical back: Neck supple.  Skin:    General: Skin is warm and dry.     Capillary Refill: Capillary refill takes less than 2 seconds.  Neurological:     General: No focal deficit present.     Mental Status: He is alert and oriented to person, place,  and time. Mental status is at baseline.     Cranial Nerves: No cranial nerve deficit.     Sensory: No sensory deficit.     Motor: No weakness.     Coordination: Coordination abnormal.     Comments: Difficulty with coordination of the lower extremities with neurological testing.  Suspect likely secondary to alcohol intoxication.  No obvious diminished grip strength or inability to lift against resistance of the left or right sides.  No obvious sensory deficits with light or firm touch.  Psychiatric:        Mood and Affect: Mood normal.     (all labs ordered are listed, but only abnormal results are displayed) Labs Reviewed  COMPREHENSIVE METABOLIC PANEL WITH GFR - Abnormal; Notable for the following components:       Result Value   Glucose, Bld 113 (*)    Calcium 8.5 (*)    Total Protein 6.3 (*)    AST 137 (*)    ALT 70 (*)    Total Bilirubin 1.5 (*)    All other components within normal limits  ETHANOL - Abnormal; Notable for the following components:   Alcohol, Ethyl (B) 324 (*)    All other components within normal limits  SALICYLATE LEVEL - Abnormal; Notable for the following components:   Salicylate Lvl <7.0 (*)    All other components within normal limits  CBC WITH DIFFERENTIAL/PLATELET - Abnormal; Notable for the following components:   RBC 3.39 (*)    Hemoglobin 11.3 (*)    HCT 33.5 (*)    RDW 15.9 (*)    All other components within normal limits  URINALYSIS, ROUTINE W REFLEX MICROSCOPIC - Abnormal; Notable for the following components:   Hgb urine dipstick SMALL (*)    Protein, ur 100 (*)    Bacteria, UA RARE (*)    All other components within normal limits    EKG: None  Radiology: DG Chest Portable 1 View Result Date: 07/28/2024 CLINICAL DATA:  AMS EXAM: PORTABLE CHEST 1 VIEW COMPARISON:  Chest x-ray 04/18/2022 FINDINGS: The heart and mediastinal contours are within normal limits. No focal consolidation. No pulmonary edema. No pleural effusion. No pneumothorax. No acute osseous abnormality. IMPRESSION: No active disease. Electronically Signed   By: Morgane  Naveau M.D.   On: 07/28/2024 19:15   CT Head Wo Contrast Result Date: 07/28/2024 CLINICAL DATA:  Mental status change, unknown cause EXAM: CT HEAD WITHOUT CONTRAST TECHNIQUE: Contiguous axial images were obtained from the base of the skull through the vertex without intravenous contrast. RADIATION DOSE REDUCTION: This exam was performed according to the departmental dose-optimization program which includes automated exposure control, adjustment of the mA and/or kV according to patient size and/or use of iterative reconstruction technique. COMPARISON:  None Available. FINDINGS: Brain: No evidence of large-territorial acute  infarction. No parenchymal hemorrhage. No mass lesion. No extra-axial collection. No mass effect or midline shift. No hydrocephalus. Basilar cisterns are patent. Mega cisterna magna. Vascular: No hyperdense vessel. Skull: No acute fracture or focal lesion. Sinuses/Orbits: Paranasal sinuses and mastoid air cells are clear. The orbits are unremarkable. Other: None. IMPRESSION: No acute intracranial abnormality. Electronically Signed   By: Morgane  Naveau M.D.   On: 07/28/2024 18:23     Procedures   Medications Ordered in the ED - No data to display  Medical Decision Making Amount and/or Complexity of Data Reviewed Labs: ordered. Radiology: ordered.   This patient presents to the ED for concern of AMS, intoxication, this involves an extensive number of treatment options, and is a complaint that carries with it a high risk of complications and morbidity.  The differential diagnosis includes stroke, alcohol intoxication, dehydration, Bells palsy   Co morbidities that complicate the patient evaluation  Hypertension, ulcerative colitis, obesity   Lab Tests:  I Ordered, and personally interpreted labs.  The pertinent results include: CBC at baseline, CMP with transaminitis with AST and ALT elevation as well as slight bilirubin elevation of 1.5, ethanol elevated at 324, UA unremarkable, salicylate level negative   Imaging Studies ordered:  I ordered imaging studies including chest x-ray, CT head I independently visualized and interpreted imaging which showed no acute cardiopulmonary process, no acute intracranial abnormality I agree with the radiologist interpretation   Consultations Obtained:  I requested consultation with none,  and discussed lab and imaging findings as well as pertinent plan - they recommend: N/A   Problem List / ED Course / Critical interventions / Medication management  Patient with past history significant for obesity,  hypertension, ulcerative colitis presents ED with concerns of altered mental status versus intoxication.  Patient reportedly was brought in by EMS with concerns of possible to mental status.  Patient called 911 after waking up from a nap after alcohol this morning and reporting that he felt that he had visual disturbance in the left eye and some inability to focus with the left eye.  Neurologic exam by EMS revealed no obvious abnormalities so code stroke was not initiated.  Patient had a strong alcohol odor to his person and concern was primarily for alcohol intoxication. On my evaluation of patient, no obvious signs of neurological deficit to suggest stroke.  More than likely alcohol intoxication given his appearance and slurred speech.  Neurologically intact with no obvious motor or sensory deficits observed.  No focal abdominal tenderness present either. Patient's lab workup is reassuring with unremarkable CBC and CMP as they are fairly consistent with baseline with observed transaminitis likely secondary to patient's alcohol use.  Ethanol level elevated at 324.  UA negative for infection.  Salicylate level negative. Patient was observed in the emergency department for a total of approximately 6 hours with improvement in cognition and motor function.  Patient was given time to metabolize alcohol and is now currently stable for discharge.  Return precautions discussed such as concerns for new or worsening symptoms.  Discharged home in stable condition. I have reviewed the patients home medicines and have made adjustments as needed   Test / Admission - Considered:  Admission considered but patient stable for outpatient follow up.  Final diagnoses:  Alcoholic intoxication without complication [facility])  Transaminitis    ED Discharge Orders     None          Cecily Legrand LABOR, PA-C 07/28/24 2243    Pamella Ozell LABOR, DO 08/05/24 1117

## 2024-07-28 NOTE — ED Notes (Signed)
 Pt ambulatory out of ER at this time. Pt alert oriented and walking with steady gait on d/c.

## 2024-08-03 ENCOUNTER — Other Ambulatory Visit: Payer: Self-pay

## 2024-08-04 ENCOUNTER — Other Ambulatory Visit (HOSPITAL_COMMUNITY): Payer: Self-pay

## 2024-08-04 ENCOUNTER — Other Ambulatory Visit: Payer: Self-pay

## 2024-08-04 ENCOUNTER — Encounter: Payer: Self-pay | Admitting: Pharmacist

## 2024-08-05 ENCOUNTER — Other Ambulatory Visit (HOSPITAL_COMMUNITY): Payer: Self-pay

## 2024-08-09 ENCOUNTER — Other Ambulatory Visit: Payer: Self-pay

## 2024-08-12 DIAGNOSIS — Z419 Encounter for procedure for purposes other than remedying health state, unspecified: Secondary | ICD-10-CM | POA: Diagnosis not present

## 2024-08-16 ENCOUNTER — Other Ambulatory Visit: Payer: Self-pay | Admitting: Pharmacy Technician

## 2024-08-16 ENCOUNTER — Other Ambulatory Visit: Payer: Self-pay

## 2024-08-16 NOTE — Progress Notes (Signed)
 Specialty Pharmacy Refill Coordination Note  Zachary [name] is a 43 y.o. male contacted today regarding refills of specialty medication(s) Upadacitinib  (Rinvoq )   Patient requested Delivery   Delivery date: 08/19/24   Verified address: [address]  [place]   Medication will be filled on 08/18/24.

## 2024-08-17 ENCOUNTER — Other Ambulatory Visit: Payer: Self-pay

## 2024-08-18 ENCOUNTER — Other Ambulatory Visit: Payer: Self-pay

## 2024-09-01 ENCOUNTER — Other Ambulatory Visit: Payer: Self-pay | Admitting: Family Medicine

## 2024-09-01 ENCOUNTER — Other Ambulatory Visit (HOSPITAL_COMMUNITY): Payer: Self-pay

## 2024-09-01 DIAGNOSIS — F32A Depression, unspecified: Secondary | ICD-10-CM

## 2024-09-01 MED ORDER — SERTRALINE HCL 100 MG PO TABS
200.0000 mg | ORAL_TABLET | Freq: Every day | ORAL | 0 refills | Status: DC
  Filled 2024-09-01: qty 180, 90d supply, fill #0

## 2024-09-06 ENCOUNTER — Other Ambulatory Visit (HOSPITAL_COMMUNITY): Payer: Self-pay

## 2024-09-06 ENCOUNTER — Other Ambulatory Visit: Payer: Self-pay

## 2024-09-06 ENCOUNTER — Ambulatory Visit (AMBULATORY_SURGERY_CENTER)

## 2024-09-06 VITALS — Ht 72.0 in | Wt 233.0 lb

## 2024-09-06 DIAGNOSIS — Z860100 Personal history of colon polyps, unspecified: Secondary | ICD-10-CM

## 2024-09-06 DIAGNOSIS — K51919 Ulcerative colitis, unspecified with unspecified complications: Secondary | ICD-10-CM

## 2024-09-06 MED ORDER — NA SULFATE-K SULFATE-MG SULF 17.5-3.13-1.6 GM/177ML PO SOLN
1.0000 | Freq: Once | ORAL | 0 refills | Status: AC
  Filled 2024-09-06 (×2): qty 354, 1d supply, fill #0

## 2024-09-06 MED ORDER — FLUZONE 0.5 ML IM SUSY
0.5000 mL | PREFILLED_SYRINGE | Freq: Once | INTRAMUSCULAR | 0 refills | Status: AC
  Filled 2024-09-06: qty 0.5, 1d supply, fill #0

## 2024-09-06 NOTE — Progress Notes (Signed)
 Denies allergies to eggs or soy products. Denies complication of anesthesia or sedation. Denies use of weight loss medication. Denies use of O2.   Emmi instructions given for colonoscopy.

## 2024-09-08 ENCOUNTER — Other Ambulatory Visit: Payer: Self-pay

## 2024-09-12 ENCOUNTER — Other Ambulatory Visit: Payer: Self-pay

## 2024-09-12 ENCOUNTER — Ambulatory Visit: Admitting: Family Medicine

## 2024-09-12 NOTE — Progress Notes (Signed)
 Specialty Pharmacy Refill Coordination Note  Zachary [name] is a 43 y.o. male contacted today regarding refills of specialty medication(s) Upadacitinib  (Rinvoq )   Patient requested Delivery   Delivery date: 09/14/24   Verified address: [address]  [name]   Medication will be filled on 09/13/24.

## 2024-09-13 ENCOUNTER — Other Ambulatory Visit (HOSPITAL_COMMUNITY): Payer: Self-pay

## 2024-09-16 ENCOUNTER — Ambulatory Visit: Admitting: Gastroenterology

## 2024-09-16 ENCOUNTER — Encounter: Payer: Self-pay | Admitting: Gastroenterology

## 2024-09-16 VITALS — BP 105/69 | HR 63 | Temp 98.1°F | Resp 10 | Ht 72.0 in | Wt 233.0 lb

## 2024-09-16 DIAGNOSIS — K5140 Inflammatory polyps of colon without complications: Secondary | ICD-10-CM | POA: Diagnosis not present

## 2024-09-16 DIAGNOSIS — K5100 Ulcerative (chronic) pancolitis without complications: Secondary | ICD-10-CM | POA: Diagnosis not present

## 2024-09-16 DIAGNOSIS — D128 Benign neoplasm of rectum: Secondary | ICD-10-CM

## 2024-09-16 DIAGNOSIS — D123 Benign neoplasm of transverse colon: Secondary | ICD-10-CM

## 2024-09-16 DIAGNOSIS — Z860101 Personal history of adenomatous and serrated colon polyps: Secondary | ICD-10-CM

## 2024-09-16 DIAGNOSIS — Z860100 Personal history of colon polyps, unspecified: Secondary | ICD-10-CM

## 2024-09-16 DIAGNOSIS — K529 Noninfective gastroenteritis and colitis, unspecified: Secondary | ICD-10-CM | POA: Diagnosis not present

## 2024-09-16 DIAGNOSIS — D124 Benign neoplasm of descending colon: Secondary | ICD-10-CM

## 2024-09-16 DIAGNOSIS — Z1211 Encounter for screening for malignant neoplasm of colon: Secondary | ICD-10-CM | POA: Diagnosis not present

## 2024-09-16 DIAGNOSIS — F419 Anxiety disorder, unspecified: Secondary | ICD-10-CM | POA: Diagnosis not present

## 2024-09-16 DIAGNOSIS — K51919 Ulcerative colitis, unspecified with unspecified complications: Secondary | ICD-10-CM

## 2024-09-16 DIAGNOSIS — F32A Depression, unspecified: Secondary | ICD-10-CM | POA: Diagnosis not present

## 2024-09-16 DIAGNOSIS — D125 Benign neoplasm of sigmoid colon: Secondary | ICD-10-CM | POA: Diagnosis not present

## 2024-09-16 DIAGNOSIS — I10 Essential (primary) hypertension: Secondary | ICD-10-CM | POA: Diagnosis not present

## 2024-09-16 MED ORDER — SODIUM CHLORIDE 0.9 % IV SOLN: 500.0000 mL | Freq: Once | INTRAVENOUS | Status: DC

## 2024-09-16 NOTE — Patient Instructions (Addendum)
 Resume previous diet.  Continue present medications.  Await pathology results.   Continue Rinvoq  30 mg PO daily for now.   YOU HAD AN ENDOSCOPIC PROCEDURE TODAY AT THE [facility]:   Refer to the procedure report that was given to you for any specific questions about what was found during the examination.  If the procedure report does not answer your questions, please call your gastroenterologist to clarify.  If you requested that your care partner not be given the details of your procedure findings, then the procedure report has been included in a sealed envelope for you to review at your convenience later.  YOU SHOULD EXPECT: Some feelings of bloating in the abdomen. Passage of more gas than usual.  Walking can help get rid of the air that was put into your GI tract during the procedure and reduce the bloating. If you had a lower endoscopy (such as a colonoscopy or flexible sigmoidoscopy) you may notice spotting of blood in your stool or on the toilet paper. If you underwent a bowel prep for your procedure, you may not have a normal bowel movement for a few days.  Please Note:  You might notice some irritation and congestion in your nose or some drainage.  This is from the oxygen used during your procedure.  There is no need for concern and it should clear up in a day or so.  SYMPTOMS TO REPORT IMMEDIATELY:  Following lower endoscopy (colonoscopy or flexible sigmoidoscopy):  Excessive amounts of blood in the stool  Significant tenderness or worsening of abdominal pains  Swelling of the abdomen that is new, acute  Fever of 100F or higher  For urgent or emergent issues, a gastroenterologist can be reached at any hour by calling [phone]. Do not use MyChart messaging for urgent concerns.    DIET:  We do recommend a small meal at first, but then you may proceed to your regular diet.  Drink plenty of fluids but you should avoid alcoholic beverages for 24 hours.  ACTIVITY:   You should plan to take it easy for the rest of today and you should NOT DRIVE or use heavy machinery until tomorrow (because of the sedation medicines used during the test).    FOLLOW UP: Our staff will call the number listed on your records the next business day following your procedure.  We will call around 7:15- 8:00 am to check on you and address any questions or concerns that you may have regarding the information given to you following your procedure. If we do not reach you, we will leave a message.     If any biopsies were taken you will be contacted by phone or by letter within the next 1-3 weeks.  Please call us  at [phone] if you have not heard about the biopsies in 3 weeks.    SIGNATURES/CONFIDENTIALITY: You and/or your care partner have signed paperwork which will be entered into your electronic medical record.  These signatures attest to the fact that that the information above on your After Visit Summary has been reviewed and is understood.  Full responsibility of the confidentiality of this discharge information lies with you and/or your care-partner.

## 2024-09-16 NOTE — Progress Notes (Signed)
 Pt's states no medical or surgical changes since previsit or office visit.

## 2024-09-16 NOTE — Progress Notes (Signed)
 [facility] [place] [facility] History and Physical   Primary Care Physician:  Almarie Waddell NOVAK, NP   Reason for Procedure:   Ulcerative colitis, history of colon polyps  Plan:    Colonoscopy     HPI: Zachary [name] is a 43 y.o. male with pan ulcerative colitis diagnosed in 2019 undergoing colonoscopy.  His last colonoscopy was in 2024 in which 6 polyps were removed, a mixture of adenomas and inflammatory polyps.  Active inflammation was noted.  He is on Rinvoq  30 mg PO daily and in clinical remission although he does have occasional loose stools and blood on toilet paper.   Past Medical History:  Diagnosis Date   Allergy    Anxiety    Depression    Hypertension    Ulcerative colitis (HCC)    Ulcerative colitis (HCC) 02/12/2022    Past Surgical History:  Procedure Laterality Date   COLONOSCOPY      Prior to Admission medications   Medication Sig Start Date End Date Taking? Authorizing Provider  amLODipine  (NORVASC ) 10 MG tablet Take 1 tablet (10 mg total) by mouth daily. 05/10/24  Yes Almarie Waddell NOVAK, NP  sertraline  (ZOLOFT ) 100 MG tablet Take 2 tablets (200 mg total) by mouth daily. 09/01/24  Yes Almarie Waddell NOVAK, NP  Upadacitinib  ER (RINVOQ ) 30 MG TB24 Take 1 tablet (30 mg total) by mouth daily. 05/09/24  Yes May, Deanna J, NP  isoniazid  (NYDRAZID ) 300 MG tablet Take 1 tablet (300 mg total) by mouth daily. Patient not taking: Reported on 09/16/2024 04/18/22   Efrain Lamar ORN, MD  Multiple Vitamin (MULTI-VITAMIN) tablet Take 1 tablet by mouth daily.    [provider]  pyridOXINE  (B-6) 50 MG tablet Take 1 tablet (50 mg total) by mouth daily. 04/18/22   Efrain Lamar ORN, MD    Current Outpatient Medications  Medication Sig Dispense Refill   amLODipine  (NORVASC ) 10 MG tablet Take 1 tablet (10 mg total) by mouth daily. 90 tablet 1   sertraline  (ZOLOFT ) 100 MG tablet Take 2 tablets (200 mg total) by mouth daily. 180 tablet 0   Upadacitinib  ER (RINVOQ ) 30 MG TB24 Take 1 tablet (30  mg total) by mouth daily. 30 tablet 5   isoniazid  (NYDRAZID ) 300 MG tablet Take 1 tablet (300 mg total) by mouth daily. (Patient not taking: Reported on 09/16/2024) 30 tablet 8   Multiple Vitamin (MULTI-VITAMIN) tablet Take 1 tablet by mouth daily.     pyridOXINE  (B-6) 50 MG tablet Take 1 tablet (50 mg total) by mouth daily. 30 tablet 8   Current Facility-Administered Medications  Medication Dose Route Frequency Provider Last Rate Last Admin   0.9 %  sodium chloride  infusion  500 mL Intravenous Once Stacia Glendia BRAVO, MD        Allergies as of 09/16/2024   (No Known Allergies)    Family History  Problem Relation Age of Onset   COPD Mother    Healthy Father    Ovarian cancer Paternal Aunt    Colon cancer Cousin    Colon polyps Neg Hx    Esophageal cancer Neg Hx    Rectal cancer Neg Hx    Stomach cancer Neg Hx     Social History   Socioeconomic History   Marital status: Single    Spouse name: Not on file   Number of children: Not on file   Years of education: Not on file   Highest education level: Master's degree (e.g., MA, MS, MEng, MEd, MSW, MBA)  Occupational  History   Not on file  Tobacco Use   Smoking status: Never    Passive exposure: Past   Smokeless tobacco: Former    Types: Snuff   Tobacco comments:    States trying to stop  Vaping Use   Vaping status: Never Used  Substance and Sexual Activity   Alcohol use: Yes    Comment: occasionally   Drug use: Never   Sexual activity: Not on file  Other Topics Concern   Not on file  Social History Narrative   Not on file   Social Drivers of Health   Financial Resource Strain: Low Risk  (05/03/2024)   Overall Financial Resource Strain (CARDIA)    Difficulty of Paying Living Expenses: Not very hard  Food Insecurity: Food Insecurity Present (05/03/2024)   Hunger Vital Sign    Worried About Running Out of Food in the Last Year: Sometimes true    Ran Out of Food in the Last Year: Sometimes true  Transportation  Needs: No Transportation Needs (05/03/2024)   PRAPARE - Administrator, Civil Service (Medical): No    Lack of Transportation (Non-Medical): No  Physical Activity: Unknown (05/03/2024)   Exercise Vital Sign    Days of Exercise per Week: 0 days    Minutes of Exercise per Session: Not on file  Stress: No Stress Concern Present (05/03/2024)   [employer] of Occupational Health - Occupational Stress Questionnaire    Feeling of Stress : Only a little  Social Connections: Socially Isolated (05/03/2024)   Social Connection and Isolation Panel    Frequency of Communication with Friends and Family: Once a week    Frequency of Social Gatherings with Friends and Family: Never    Attends Religious Services: Never    Database administrator or Organizations: No    Attends Engineer, structural: Not on file    Marital Status: Never married  Intimate Partner Violence: Not on file    Review of Systems:  All other review of systems negative except as mentioned in the HPI.  Physical Exam: Vital signs BP (!) 140/90   Pulse 92   Temp 98.1 F (36.7 C)   Ht 6' (1.829 m)   Wt 233 lb (105.7 kg)   SpO2 99%   BMI 31.60 kg/m   General:   Alert,  Well-developed, well-nourished, pleasant and cooperative in NAD Airway:  Mallampati 2 Lungs:  Clear throughout to auscultation.   Heart:  Regular rate and rhythm; no murmurs, clicks, rubs,  or gallops. Abdomen:  Soft, nontender and nondistended. Normal bowel sounds.   Neuro/Psych:  Normal mood and affect. A and O x 3   Junko Ohagan E. Stacia, MD [facility]

## 2024-09-16 NOTE — Progress Notes (Signed)
 Called to room to assist during endoscopic procedure.  Patient ID and intended procedure confirmed with present staff. Received instructions for my participation in the procedure from the performing physician.

## 2024-09-16 NOTE — Progress Notes (Signed)
 Sedate, gd SR, tolerated procedure well, VSS, report to RN

## 2024-09-16 NOTE — Op Note (Signed)
 [facility] Patient Name: Zachary [name] Procedure Date: 09/16/2024 12:09 PM MRN: [id number] Endoscopist: Glendia E. Stacia , MD, [id number] Age: 43 Referring MD:  Date of Birth: 1981/08/26 Gender: Male Account #: [id number] Procedure:                Colonoscopy Indications:              High risk colon cancer surveillance: Personal                            history of adenoma less than 10 mm in size,                            Incidental - Follow-up of chronic ulcerative                            pancolitis Medicines:                Monitored Anesthesia Care Procedure:                Pre-Anesthesia Assessment:                           - Prior to the procedure, a History and Physical                            was performed, and patient medications and                            allergies were reviewed. The patient's tolerance of                            previous anesthesia was also reviewed. The risks                            and benefits of the procedure and the sedation                            options and risks were discussed with the patient.                            All questions were answered, and informed consent                            was obtained. Prior Anticoagulants: The patient has                            taken no anticoagulant or antiplatelet agents. ASA                            Grade Assessment: II - A patient with mild systemic                            disease. After reviewing the risks and benefits,  the patient was deemed in satisfactory condition to                            undergo the procedure.                           After obtaining informed consent, the colonoscope                            was passed under direct vision. Throughout the                            procedure, the patient's blood pressure, pulse, and                            oxygen saturations were monitored continuously. The                             Olympus Scope SN: [id number] was introduced through                            the anus and advanced to the the terminal ileum,                            with identification of the appendiceal orifice and                            IC valve. The colonoscopy was performed without                            difficulty. The patient tolerated the procedure                            well. The quality of the bowel preparation was                            good. The terminal ileum, ileocecal valve,                            appendiceal orifice, and rectum were photographed.                            The bowel preparation used was SUPREP via split                            dose instruction. Scope In: 12:23:16 PM Scope Out: 12:56:26 PM Scope Withdrawal Time: 0 hours 28 minutes 57 seconds  Total Procedure Duration: 0 hours 33 minutes 10 seconds  Findings:                 The perianal and digital rectal examinations were                            normal. Pertinent negatives include normal  sphincter tone and no palpable rectal lesions.                           Many pedunculated, semi-pedunculated and sessile                            polyps were found in the rectum, sigmoid colon,                            descending colon and transverse colon. Many of                            these polyps were erythematous with ulcerated                            apices. The polyps were 4 to 12 mm in size. Fifteen                            of these polyps were removed with a cold snare.                            Resection and retrieval were complete. Estimated                            blood loss was minimal.                           A localized area of pale mucosa with decreased                            vascular pattern was seen in the cecum. This was                            biopsied.                           Scattered pseudopolyps were  found in the sigmoid                            colon, descending colon, in the transverse colon,                            in the ascending colon and in the cecum. Biopsies                            were taken with a cold forceps for histology.                            Estimated blood loss was minimal.                           Focal, localized mild inflammation characterized by  erosions and erythema was found in the rectum and                            in the transverse colon. Scarring was noted in                            Biopsies were taken with a cold forceps for                            histology. Estimated blood loss was minimal.                           The exam was otherwise without abnormality.                            Biopsies were taken segmentally in the colon to                            assess for histologic inflammation.                           The terminal ileum appeared normal. Complications:            No immediate complications. Estimated Blood Loss:     Estimated blood loss was minimal. Impression:               - Many 4 to 12 mm polyps in the rectum, in the                            sigmoid colon, in the descending colon and in the                            transverse colon, removed with a cold snare.                            Resected and retrieved. Suspect these are                            inflammatory, although adenomas also possible.                           - Pseudopolyps in the descending colon, in the                            transverse colon, in the ascending colon and in the                            cecum. Biopsied.                           - Focal area of pale mucosa with decreased vascular                            pattern in the cecum. Biopsied. Suspect scar tissue.                           -  Localized mild inflammation characterized by                            erosions/small ulcerations was found  in the rectum                            and in the transverse colon. Biopsied.                           - The examination was otherwise normal. There were                            no extensive areas of active inflammation noted.                            Biopsies taken segmentally.                           - The examined portion of the ileum was normal.                           - Overall, findings suggestive of fairly                            well-controlled ulcerative colitis on Rinvoq . Recommendation:           - Patient has a contact number available for                            emergencies. The signs and symptoms of potential                            delayed complications were discussed with the                            patient. Return to normal activities tomorrow.                            Written discharge instructions were provided to the                            patient.                           - Resume previous diet.                           - Continue present medications.                           - Await pathology results.                           - Repeat colonoscopy (date not yet determined) for  surveillance based on pathology results. Patient                            should start dysplasia surveillance colonoscopies                            in 2027, sooner if any of the polyps removed today                            were adneomatous.                           - Continue Rinvoq  30 mg PO daily for now.                           - Recommend obtaining fecal calprotectin for                            baseline comparison. Draylon Mercadel E. Stacia, MD 09/16/2024 1:14:32 PM This report has been signed electronically.

## 2024-09-19 ENCOUNTER — Telehealth: Payer: Self-pay | Admitting: Lactation Services

## 2024-09-19 NOTE — Telephone Encounter (Signed)
 No answer left voice mail

## 2024-09-20 ENCOUNTER — Telehealth: Payer: Self-pay

## 2024-09-20 ENCOUNTER — Ambulatory Visit: Admitting: Family Medicine

## 2024-09-20 LAB — SURGICAL PATHOLOGY

## 2024-09-20 NOTE — Telephone Encounter (Signed)
 Dr. Stacia per procedure report from 09/16/24 would like patient to continue Ronvoq 30 mg daily and have a fecal calprotectin for a baseline comparison. Left message for patient to return my call.

## 2024-09-21 NOTE — Telephone Encounter (Signed)
 Left message for patient to return my call.Will send MyChart message regarding stool test.

## 2024-09-28 ENCOUNTER — Ambulatory Visit: Payer: Self-pay | Admitting: Gastroenterology

## 2024-09-28 NOTE — Progress Notes (Signed)
 Chis,  The biopsies taken throughout your colon showed either mildly active or inactive colitis.  This means that your ulcerative colitis is mostly controlled with the Rinvoq , but you are not quite at the optimal level of disease control (our goal is to see no active colitis).   Given your symptom improvement, and difficulty finding medications that have worked well, I would not recommend changing medications at this time. I would recommend we obtain a fecal calprotectin as previously discussed.  This provides with a non-invasive way to assess inflammation in the colon.  We can compare your level now with another level in a few months.  All of the polyps removed from your colon were inflammatory and related to your colitis.  None of the polyps had precancerous changes.  This is good news.  I would recommend you repeat colonoscopy in 2 years to screen for dysplasia/precancerous changes in the colon.

## 2024-09-30 ENCOUNTER — Ambulatory Visit (HOSPITAL_COMMUNITY)
Admission: EM | Admit: 2024-09-30 | Discharge: 2024-09-30 | Disposition: A | Discharge status: Home / Self Care | Attending: Emergency Medicine | Admitting: Emergency Medicine

## 2024-09-30 ENCOUNTER — Encounter: Payer: Self-pay | Admitting: Family Medicine

## 2024-09-30 ENCOUNTER — Encounter (HOSPITAL_COMMUNITY): Payer: Self-pay

## 2024-09-30 ENCOUNTER — Ambulatory Visit (HOSPITAL_COMMUNITY)

## 2024-09-30 DIAGNOSIS — R0989 Other specified symptoms and signs involving the circulatory and respiratory systems: Secondary | ICD-10-CM | POA: Diagnosis not present

## 2024-09-30 DIAGNOSIS — J209 Acute bronchitis, unspecified: Secondary | ICD-10-CM

## 2024-09-30 DIAGNOSIS — J40 Bronchitis, not specified as acute or chronic: Secondary | ICD-10-CM

## 2024-09-30 DIAGNOSIS — R058 Other specified cough: Secondary | ICD-10-CM

## 2024-09-30 LAB — POC COVID19/FLU A&B COMBO
Covid Antigen, POC: NEGATIVE
Influenza A Antigen, POC: NEGATIVE
Influenza B Antigen, POC: NEGATIVE

## 2024-09-30 MED ORDER — AZITHROMYCIN 250 MG PO TABS
ORAL_TABLET | ORAL | 0 refills | Status: DC
  Filled 2024-09-30: qty 6, 5d supply, fill #0

## 2024-09-30 MED ORDER — PROMETHAZINE-DM 6.25-15 MG/5ML PO SYRP
5.0000 mL | ORAL_SOLUTION | Freq: Every evening | ORAL | 0 refills | Status: DC | PRN
  Filled 2024-09-30: qty 60, 12d supply, fill #0

## 2024-09-30 MED ORDER — GUAIFENESIN 400 MG PO TABS
ORAL_TABLET | ORAL | 0 refills | Status: DC
  Filled 2024-09-30: qty 30, 10d supply, fill #0

## 2024-09-30 NOTE — Discharge Instructions (Signed)
 Your rapid influenza and COVID-19 antigen test today was negative.  No further influenza testing is indicated.  Please consider retesting for COVID-19 in the next 2 to 3 days, particularly if you are not feeling any better.  You are welcome to return here to urgent care to repeat the test or you can take a home COVID-19 test.   Your chest x-ray was not concerning for pneumonia at this time.  I believe that this and your physical exam findings and the history that you provided to me today, you are dealing with acute bronchitis.  I do think an antibiotic will be helpful in calming your respiratory inflammation.  I also recommend a daytime cough medicine to help promote sputum production and a nighttime cough medicine to help calm your nighttime cough so that you can get some sleep    If both your COVID-19 tests are negative, then you can safely assume that your illness is due to one of the many less serious illnesses circulating in our community right now.     Conservative care is recommended with rest, drinking plenty of clear fluids, eating only when hungry, taking supportive medications for your symptoms and avoiding being around other people.  Please remain at home until you are fever free for 24 hours without the use of antifever medications such as Tylenol and ibuprofen.   Please read below to learn more about the medications, dosages and frequencies that I recommend to help alleviate your symptoms and to get you feeling better soon:   Z-Pak (azithromycin):  Please take two (2) tablets on day one and one tablet daily thereafter until the prescription is complete.This antibiotic can cause upset stomach, this will resolve once antibiotics are complete.  You are welcome to take a probiotic, eat yogurt, take Imodium while taking this medication.  Please avoid other systemic medications such as Maalox, Pepto-Bismol or milk of magnesia as they can interfere with the body's ability to absorb the  antibiotics.       Robitussin, Mucinex (guaifenesin): This is a daytime expectorant.  This single symptom reliever helps break up chest congestion and loosen up thick nasal drainage making phlegm and drainage easier to cough up and to blow out from your nose.  I recommend taking 400 mg in either liquid or tablet form three times daily as needed.  I do not recommend the 12-hour extended relief version or doses higher than 400 mg per each dose as these often make some patients feel jittery or jumpy and can interfere with sleep.  I also do not recommend that you purchase guaifenesin with the ingredient  DM which is dextromethorphan, a cough suppressant which I only recommend taking at bedtime.  Guaifenesin 400 mg is a safe dose for people who are being treated for high blood pressure.     Promethazine DM: Promethazine is both a nasal decongestant that dries up mucous membranes and an antinausea medication.  Promethazine often makes most patients feel fairly sleepy.  DM is dextromethorphan, a single symptom reliever which is a cough suppressant found in many over-the-counter cough medications and combination cold preparations.  Please take 5 mL before bedtime to minimize your cough which will help you sleep better.  I have sent a prescription for this medication to your pharmacy because it cannot be purchased over-the-counter.   If symptoms have not meaningfully improved in the next 5 to 7 days, please return for repeat evaluation or follow-up with your regular provider.  If symptoms have worsened  in the next 3 to 5 days, please go to the emergency room for further evaluation.    Thank you for visiting urgent care today.  We appreciate the opportunity to participate in your care.

## 2024-09-30 NOTE — ED Provider Notes (Signed)
 [facility]    CSN: [id number] Arrival date & time: 09/30/24  1758    HISTORY   Chief Complaint  Patient presents with   Cough   HPI Zachary [name] is a pleasant, 43 y.o. male who presents to urgent care today. Complains of cough productive of large amounts of dark-colored sputum, nasal congestion, headache, fatigue and bodyaches that began yesterday morning.  States his chest hurts when he coughs.  States symptoms came on very quickly.  Patient is requesting COVID and influenza testing today.  States he took Tylenol sinus medication without relief of his current symptoms.  Denies fever, chills, sore throat, nausea, vomiting, headache, loss of taste or smell, known sick contacts.   The history is provided by the patient.  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 Authorizing Provider  amLODipine  (NORVASC ) 10 MG tablet Take 1 tablet (10 mg total) by mouth daily. 05/10/24   Almarie Waddell NOVAK, NP  isoniazid  (NYDRAZID ) 300 MG tablet Take 1 tablet (300 mg total) by mouth daily. Patient not taking: Reported on 09/16/2024 04/18/22   Efrain Lamar ORN, MD  Multiple Vitamin (MULTI-VITAMIN) tablet Take 1 tablet by mouth daily.    [provider]  pyridOXINE  (B-6) 50 MG tablet Take 1 tablet (50 mg total) by mouth daily. 04/18/22   Comer, Lamar ORN, MD  sertraline  (ZOLOFT ) 100 MG tablet Take 2 tablets (200 mg total) by  mouth daily. 09/01/24   Almarie Waddell NOVAK, NP  Upadacitinib  ER (RINVOQ ) 30 MG TB24 Take 1 tablet (30 mg total) by mouth daily. 05/09/24   May, Deanna J, NP    Family History Family History  Problem Relation Age of Onset   COPD Mother    Healthy Father    Ovarian cancer Paternal Aunt    Colon cancer Cousin    Colon polyps Neg Hx    Esophageal cancer Neg Hx    Rectal cancer Neg Hx    Stomach cancer Neg Hx    Social History Social History   Tobacco Use   Smoking status: Never    Passive exposure: Past   Smokeless tobacco: Former    Types: Snuff   Tobacco comments:    States trying to stop  Vaping Use   Vaping status: Never Used  Substance Use Topics   Alcohol use: Yes    Comment: occasionally   Drug use: Never   Allergies   Patient has no known allergies.  Review of Systems Review of Systems  Respiratory:  Positive for cough.    Pertinent findings revealed after performing a 14 point review of systems has been noted in the history of present illness.  Physical Exam Vital Signs BP 129/74 (BP Location: Right Arm)   Pulse (!) 101   Temp 98.4 F (36.9 C) (Oral)   Resp 18   SpO2 96%   No data found.  Physical Exam Vitals and nursing note reviewed.  Constitutional:      General: He is awake. He is not in acute distress.    Appearance: Normal appearance. He is well-developed and well-groomed. He is not ill-appearing.  HENT:     Head: Normocephalic and atraumatic.     Salivary Glands: Right salivary gland is not diffusely enlarged or tender. Left salivary gland is not diffusely enlarged or tender.     Right Ear: Hearing, tympanic membrane, ear canal and external ear normal.     Left Ear: Hearing, tympanic membrane, ear canal and external ear normal.     Nose: Mucosal edema, congestion and rhinorrhea present. Rhinorrhea is clear.     Right Turbinates: Not enlarged, swollen or pale.     Left Turbinates: Not enlarged, swollen or pale.     Right Sinus: No maxillary sinus  tenderness or frontal sinus tenderness.     Left Sinus: No maxillary sinus tenderness or frontal sinus tenderness.     Mouth/Throat:     Lips: Pink. No lesions.     Mouth: Mucous membranes are moist. No oral lesions.     Tongue: No lesions. Tongue does not deviate from midline.     Palate: No mass and lesions.     Pharynx: Oropharynx is clear. Uvula midline. Posterior oropharyngeal erythema and uvula swelling present. No pharyngeal swelling, oropharyngeal exudate or postnasal drip.     Tonsils: No tonsillar exudate. 0 on the right. 0 on the left.  Eyes:     General: Lids are normal.        Right eye: No discharge.        Left eye: No discharge.     Conjunctiva/sclera: Conjunctivae normal.     Right eye: Right conjunctiva is not injected.     Left eye: Left conjunctiva is not injected.  Neck:     Trachea: Trachea and phonation normal.  Cardiovascular:     Rate and Rhythm: Normal rate and regular rhythm.     Heart sounds: Normal heart sounds.  Pulmonary:     Effort: Pulmonary effort is normal.     Breath sounds: Examination of the right-lower field reveals rhonchi and rales. Rhonchi and rales present. No wheezing.  Chest:     Chest wall: No tenderness.  Musculoskeletal:        General: Normal range of motion.     Cervical back: Full passive range of motion without pain, normal range of motion and neck supple. Normal range of motion.  Lymphadenopathy:     Cervical: Cervical adenopathy present.     Right cervical: Superficial cervical adenopathy present.     Left cervical: Superficial cervical adenopathy present.  Skin:    General: Skin is warm and dry.     Findings: No erythema or rash.  Neurological:     General: No focal deficit present.     Mental Status: He is alert and oriented to person, place, and time. Mental status is at baseline.  Psychiatric:        Attention and Perception: Attention and perception normal.        Mood and Affect: Mood and affect normal.         Speech: Speech normal.        Behavior: Behavior normal. Behavior is cooperative.        Thought Content: Thought content normal.     Visual Acuity Right Eye Distance:   Left Eye Distance:   Bilateral Distance:  Right Eye Near:   Left Eye Near:    Bilateral Near:     UC Couse / Diagnostics / Procedures:     Radiology DG Chest 2 View Result Date: 09/30/2024 CLINICAL DATA:  Productive cough, rales EXAM: CHEST - 2 VIEW COMPARISON:  07/28/2024 FINDINGS: The heart size and mediastinal contours are within normal limits. Both lungs are clear. The visualized skeletal structures are unremarkable. IMPRESSION: No active cardiopulmonary disease. Electronically Signed   By: Ozell Daring M.D.   On: 09/30/2024 19:20    Procedures Procedures (including critical care time) EKG  Pending results:  Labs Reviewed  POC COVID19/FLU A&B COMBO    Medications Ordered in UC: Medications - No data to display  UC Diagnoses / Final Clinical Impressions(s)   I have reviewed the triage vital signs and the nursing notes.  Pertinent labs & imaging results that were available during my care of the patient were reviewed by me and considered in my medical decision making (see chart for details).    Final diagnoses:  Productive cough  Acute bronchitis, unspecified organism   Chest x-ray not concerning for pneumonia at this time.  Patient advised physical exam findings are concerning for bronchitis and that it is most likely viral.  That being said, due to the acute onset of his symptoms and the quality of his sputum, which in fact is dark green per my observation, recommend 5-day course of azithromycin to cover atypical bacteria which may be causing a secondary bacterial infection to an initial viral bronchitis.  Patient advised to take guaifenesin to promote expectoration.  Promethazine DM provided for nighttime cough given the patient states he did not sleep well last night due to to coughing.  SpO2 was  normal on arrival today, patient appears to be in no acute distress other than when coughing, do not believe but agonist inhalers needed at this time.  Patient advised to follow-up closely if symptoms or not improving or are worsening in the next 3 to 5 days.  Please see discharge instructions below for details of plan of care as provided to patient. ED Prescriptions     Medication Sig Dispense Auth. Provider   azithromycin (ZITHROMAX) 250 MG tablet Take 2 tablets (500 mg total) by mouth daily for 1 day, THEN 1 tablet (250 mg total) daily for 4 days. 6 tablet Joesph Shaver Scales, PA-C   guaifenesin (HUMIBID E) 400 MG TABS tablet Take 1 tablet 3 times daily as needed for chest congestion and cough 30 tablet Joesph Shaver Scales, PA-C   promethazine-dextromethorphan (PROMETHAZINE-DM) 6.25-15 MG/5ML syrup Take 5 mLs by mouth at bedtime as needed for cough. 60 mL Joesph Shaver Scales, PA-C      PDMP not reviewed this encounter.  Pending results:  Labs Reviewed  POC COVID19/FLU A&B COMBO      Discharge Instructions      Your rapid influenza and COVID-19 antigen test today was negative.  No further influenza testing is indicated.  Please consider retesting for COVID-19 in the next 2 to 3 days, particularly if you are not feeling any better.  You are welcome to return here to urgent care to repeat the test or you can take a home COVID-19 test.   Your chest x-ray was not concerning for pneumonia at this time.  I believe that this and your physical exam findings and the history that you provided to me today, you are dealing with acute bronchitis.  I do think an antibiotic will be helpful in  calming your respiratory inflammation.  I also recommend a daytime cough medicine to help promote sputum production and a nighttime cough medicine to help calm your nighttime cough so that you can get some sleep    If both your COVID-19 tests are negative, then you can safely assume that your illness is  due to one of the many less serious illnesses circulating in our community right now.     Conservative care is recommended with rest, drinking plenty of clear fluids, eating only when hungry, taking supportive medications for your symptoms and avoiding being around other people.  Please remain at home until you are fever free for 24 hours without the use of antifever medications such as Tylenol and ibuprofen.   Please read below to learn more about the medications, dosages and frequencies that I recommend to help alleviate your symptoms and to get you feeling better soon:   Z-Pak (azithromycin):  Please take two (2) tablets on day one and one tablet daily thereafter until the prescription is complete.This antibiotic can cause upset stomach, this will resolve once antibiotics are complete.  You are welcome to take a probiotic, eat yogurt, take Imodium while taking this medication.  Please avoid other systemic medications such as Maalox, Pepto-Bismol or milk of magnesia as they can interfere with the body's ability to absorb the antibiotics.       Robitussin, Mucinex (guaifenesin): This is a daytime expectorant.  This single symptom reliever helps break up chest congestion and loosen up thick nasal drainage making phlegm and drainage easier to cough up and to blow out from your nose.  I recommend taking 400 mg in either liquid or tablet form three times daily as needed.  I do not recommend the 12-hour extended relief version or doses higher than 400 mg per each dose as these often make some patients feel jittery or jumpy and can interfere with sleep.  I also do not recommend that you purchase guaifenesin with the ingredient  DM which is dextromethorphan, a cough suppressant which I only recommend taking at bedtime.  Guaifenesin 400 mg is a safe dose for people who are being treated for high blood pressure.     Promethazine DM: Promethazine is both a nasal decongestant that dries up mucous membranes and an  antinausea medication.  Promethazine often makes most patients feel fairly sleepy.  DM is dextromethorphan, a single symptom reliever which is a cough suppressant found in many over-the-counter cough medications and combination cold preparations.  Please take 5 mL before bedtime to minimize your cough which will help you sleep better.  I have sent a prescription for this medication to your pharmacy because it cannot be purchased over-the-counter.   If symptoms have not meaningfully improved in the next 5 to 7 days, please return for repeat evaluation or follow-up with your regular provider.  If symptoms have worsened in the next 3 to 5 days, please go to the emergency room for further evaluation.    Thank you for visiting urgent care today.  We appreciate the opportunity to participate in your care.       Disposition Upon Discharge:  Condition: stable for discharge home  Patient presented with an acute illness with associated systemic symptoms and significant discomfort requiring urgent management. In my opinion, this is a condition that a prudent lay person (someone who possesses an average knowledge of health and medicine) may potentially expect to result in complications if not addressed urgently such as respiratory distress, impairment of  bodily function or dysfunction of bodily organs.   Routine symptom specific, illness specific and/or disease specific instructions were discussed with the patient and/or caregiver at length.   As such, the patient has been evaluated and assessed, work-up was performed and treatment was provided in alignment with urgent care protocols and evidence based medicine.  Patient/parent/caregiver has been advised that the patient may require follow up for further testing and treatment if the symptoms continue in spite of treatment, as clinically indicated and appropriate.  Patient/parent/caregiver has been advised to return [facility] or PCP if no better; to PCP or  the Emergency Department if new signs and symptoms develop, or if the current signs or symptoms continue to change or worsen for further workup, evaluation and treatment as clinically indicated and appropriate  The patient will follow up with their current PCP if and as advised. If the patient does not currently have a PCP we will assist them in obtaining one.   The patient may need specialty follow up if the symptoms continue, in spite of conservative treatment and management, for further workup, evaluation, consultation and treatment as clinically indicated and appropriate.  Patient/parent/caregiver verbalized understanding and agreement of plan as discussed.  All questions were addressed during visit.  Please see discharge instructions below for further details of plan.  This office note has been dictated using Teaching laboratory technician.  Unfortunately, this method of dictation can sometimes lead to typographical or grammatical errors.  I apologize for your inconvenience in advance if this occurs.  Please do not hesitate to reach out to me if clarification is needed.      Joesph Shaver Scales, [place] 10/02/24 [phone]

## 2024-09-30 NOTE — ED Triage Notes (Signed)
 Pt c/o cough, congestion, headache, fatigue, and body aches. Requesting COVID/Flu testing. Took tylenol sinus meds today with no relief.

## 2024-10-01 ENCOUNTER — Other Ambulatory Visit (HOSPITAL_COMMUNITY): Payer: Self-pay

## 2024-10-03 ENCOUNTER — Other Ambulatory Visit: Payer: Self-pay

## 2024-10-03 ENCOUNTER — Encounter (HOSPITAL_COMMUNITY): Payer: Self-pay

## 2024-10-03 ENCOUNTER — Other Ambulatory Visit (HOSPITAL_COMMUNITY): Payer: Self-pay

## 2024-10-03 MED ORDER — ALBUTEROL SULFATE HFA 108 (90 BASE) MCG/ACT IN AERS
2.0000 | INHALATION_SPRAY | Freq: Four times a day (QID) | RESPIRATORY_TRACT | 0 refills | Status: DC | PRN
  Filled 2024-10-03: qty 6.7, 25d supply, fill #0

## 2024-10-04 ENCOUNTER — Other Ambulatory Visit: Payer: Self-pay

## 2024-10-04 ENCOUNTER — Other Ambulatory Visit (HOSPITAL_COMMUNITY): Payer: Self-pay

## 2024-10-06 ENCOUNTER — Other Ambulatory Visit (HOSPITAL_COMMUNITY): Payer: Self-pay

## 2024-10-06 ENCOUNTER — Other Ambulatory Visit: Payer: Self-pay | Admitting: Pharmacy Technician

## 2024-10-06 ENCOUNTER — Encounter (INDEPENDENT_AMBULATORY_CARE_PROVIDER_SITE_OTHER): Payer: Self-pay

## 2024-10-06 ENCOUNTER — Other Ambulatory Visit: Payer: Self-pay

## 2024-10-06 NOTE — Progress Notes (Signed)
 Specialty Pharmacy Refill Coordination Note  Hodges Treiber is a 43 y.o. male contacted today regarding refills of specialty medication(s) Upadacitinib  (Rinvoq )   Patient requested (Patient-Rptd) Delivery   Delivery date: 10/07/2024 Verified address: (Patient-Rptd) [address]   Medication will be filled on: 10/06/2024

## 2024-10-08 ENCOUNTER — Encounter: Payer: Self-pay | Admitting: Family Medicine

## 2024-10-09 ENCOUNTER — Other Ambulatory Visit: Payer: Self-pay

## 2024-10-09 ENCOUNTER — Observation Stay (HOSPITAL_COMMUNITY)
Admission: EM | Admit: 2024-10-09 | Discharge: 2024-10-10 | Disposition: A | Discharge status: Home / Self Care | Attending: General Surgery | Admitting: General Surgery

## 2024-10-09 ENCOUNTER — Emergency Department (HOSPITAL_COMMUNITY)

## 2024-10-09 ENCOUNTER — Encounter (HOSPITAL_COMMUNITY): Payer: Self-pay

## 2024-10-09 DIAGNOSIS — M47812 Spondylosis without myelopathy or radiculopathy, cervical region: Secondary | ICD-10-CM | POA: Diagnosis not present

## 2024-10-09 DIAGNOSIS — K5190 Ulcerative colitis, unspecified, without complications: Secondary | ICD-10-CM | POA: Diagnosis not present

## 2024-10-09 DIAGNOSIS — Z743 Need for continuous supervision: Secondary | ICD-10-CM | POA: Diagnosis not present

## 2024-10-09 DIAGNOSIS — Z79899 Other long term (current) drug therapy: Secondary | ICD-10-CM | POA: Insufficient documentation

## 2024-10-09 DIAGNOSIS — R160 Hepatomegaly, not elsewhere classified: Secondary | ICD-10-CM | POA: Diagnosis not present

## 2024-10-09 DIAGNOSIS — F419 Anxiety disorder, unspecified: Secondary | ICD-10-CM | POA: Insufficient documentation

## 2024-10-09 DIAGNOSIS — F1090 Alcohol use, unspecified, uncomplicated: Secondary | ICD-10-CM | POA: Insufficient documentation

## 2024-10-09 DIAGNOSIS — W182XXA Fall in (into) shower or empty bathtub, initial encounter: Secondary | ICD-10-CM | POA: Diagnosis not present

## 2024-10-09 DIAGNOSIS — S2241XA Multiple fractures of ribs, right side, initial encounter for closed fracture: Principal | ICD-10-CM | POA: Insufficient documentation

## 2024-10-09 DIAGNOSIS — I10 Essential (primary) hypertension: Secondary | ICD-10-CM | POA: Insufficient documentation

## 2024-10-09 DIAGNOSIS — W19XXXA Unspecified fall, initial encounter: Secondary | ICD-10-CM | POA: Diagnosis not present

## 2024-10-09 DIAGNOSIS — S270XXA Traumatic pneumothorax, initial encounter: Secondary | ICD-10-CM | POA: Insufficient documentation

## 2024-10-09 DIAGNOSIS — R918 Other nonspecific abnormal finding of lung field: Secondary | ICD-10-CM | POA: Diagnosis not present

## 2024-10-09 DIAGNOSIS — S2239XA Fracture of one rib, unspecified side, initial encounter for closed fracture: Secondary | ICD-10-CM | POA: Diagnosis present

## 2024-10-09 DIAGNOSIS — M5030 Other cervical disc degeneration, unspecified cervical region: Secondary | ICD-10-CM | POA: Diagnosis not present

## 2024-10-09 DIAGNOSIS — J9811 Atelectasis: Secondary | ICD-10-CM | POA: Diagnosis not present

## 2024-10-09 DIAGNOSIS — J939 Pneumothorax, unspecified: Secondary | ICD-10-CM | POA: Diagnosis not present

## 2024-10-09 DIAGNOSIS — T797XXA Traumatic subcutaneous emphysema, initial encounter: Secondary | ICD-10-CM | POA: Diagnosis not present

## 2024-10-09 DIAGNOSIS — K4090 Unilateral inguinal hernia, without obstruction or gangrene, not specified as recurrent: Secondary | ICD-10-CM | POA: Diagnosis not present

## 2024-10-09 DIAGNOSIS — S199XXA Unspecified injury of neck, initial encounter: Secondary | ICD-10-CM | POA: Diagnosis not present

## 2024-10-09 DIAGNOSIS — R519 Headache, unspecified: Secondary | ICD-10-CM | POA: Diagnosis not present

## 2024-10-09 LAB — COMPREHENSIVE METABOLIC PANEL WITH GFR
ALT: 71 U/L — ABNORMAL HIGH (ref 0–44)
AST: 116 U/L — ABNORMAL HIGH (ref 15–41)
Albumin: 3.3 g/dL — ABNORMAL LOW (ref 3.5–5.0)
Alkaline Phosphatase: 89 U/L (ref 38–126)
Anion gap: 18 — ABNORMAL HIGH (ref 5–15)
BUN: 12 mg/dL (ref 6–20)
CO2: 24 mmol/L (ref 22–32)
Calcium: 8.1 mg/dL — ABNORMAL LOW (ref 8.9–10.3)
Chloride: 97 mmol/L — ABNORMAL LOW (ref 98–111)
Creatinine, Ser: 1.28 mg/dL — ABNORMAL HIGH (ref 0.61–1.24)
GFR, Estimated: >60 mL/min (ref >60)
Glucose, Bld: 162 mg/dL — ABNORMAL HIGH (ref 70–99)
Potassium: 3.2 mmol/L — ABNORMAL LOW (ref 3.5–5.1)
Sodium: 139 mmol/L (ref 135–145)
Total Bilirubin: 0.9 mg/dL (ref 0.0–1.2)
Total Protein: 6.6 g/dL (ref 6.5–8.1)

## 2024-10-09 LAB — CBC WITH DIFFERENTIAL/PLATELET
Abs Immature Granulocytes: 0.03 K/uL (ref 0.00–0.07)
Basophils Absolute: 0 K/uL (ref 0.0–0.1)
Basophils Relative: 1 %
Eosinophils Absolute: 0 K/uL (ref 0.0–0.5)
Eosinophils Relative: 0 %
HCT: 32.8 % — ABNORMAL LOW (ref 39.0–52.0)
Hemoglobin: 11 g/dL — ABNORMAL LOW (ref 13.0–17.0)
Immature Granulocytes: 1 %
Lymphocytes Relative: 18 %
Lymphs Abs: 0.9 K/uL (ref 0.7–4.0)
MCH: 32.3 pg (ref 26.0–34.0)
MCHC: 33.5 g/dL (ref 30.0–36.0)
MCV: 96.2 fL (ref 80.0–100.0)
Monocytes Absolute: 0.4 K/uL (ref 0.1–1.0)
Monocytes Relative: 8 %
Neutro Abs: 3.5 K/uL (ref 1.7–7.7)
Neutrophils Relative %: 72 %
Platelets: 303 K/uL (ref 150–400)
RBC: 3.41 MIL/uL — ABNORMAL LOW (ref 4.22–5.81)
RDW: 14.7 % (ref 11.5–15.5)
WBC: 4.8 K/uL (ref 4.0–10.5)
nRBC: 0 % (ref 0.0–0.2)

## 2024-10-09 LAB — TYPE AND SCREEN
ABO/RH(D): O POS
Antibody Screen: NEGATIVE

## 2024-10-09 LAB — I-STAT CHEM 8, ED
BUN: 13 mg/dL (ref 6–20)
Calcium, Ion: 0.98 mmol/L — ABNORMAL LOW (ref 1.15–1.40)
Chloride: 99 mmol/L (ref 98–111)
Creatinine, Ser: 1.6 mg/dL — ABNORMAL HIGH (ref 0.61–1.24)
Glucose, Bld: 164 mg/dL — ABNORMAL HIGH (ref 70–99)
HCT: 34 % — ABNORMAL LOW (ref 39.0–52.0)
Hemoglobin: 11.6 g/dL — ABNORMAL LOW (ref 13.0–17.0)
Potassium: 3.3 mmol/L — ABNORMAL LOW (ref 3.5–5.1)
Sodium: 140 mmol/L (ref 135–145)
TCO2: 23 mmol/L (ref 22–32)

## 2024-10-09 LAB — PROTIME-INR
INR: 1.3 — ABNORMAL HIGH (ref 0.8–1.2)
Prothrombin Time: 17.1 s — ABNORMAL HIGH (ref 11.4–15.2)

## 2024-10-09 LAB — ABO/RH: ABO/RH(D): O POS

## 2024-10-09 LAB — ETHANOL: Alcohol, Ethyl (B): 257 mg/dL — ABNORMAL HIGH (ref <15)

## 2024-10-09 LAB — HIV ANTIBODY (ROUTINE TESTING W REFLEX): HIV Screen 4th Generation wRfx: NONREACTIVE

## 2024-10-09 MED ORDER — MORPHINE SULFATE (PF) 4 MG/ML IV SOLN
4.0000 mg | Freq: Once | INTRAVENOUS | Status: AC
  Administered 2024-10-09: 4 mg via INTRAVENOUS
  Filled 2024-10-09: qty 1

## 2024-10-09 MED ORDER — ENOXAPARIN SODIUM 40 MG/0.4ML IJ SOSY
40.0000 mg | PREFILLED_SYRINGE | INTRAMUSCULAR | Status: DC
  Administered 2024-10-09: 40 mg via SUBCUTANEOUS
  Filled 2024-10-09: qty 0.4

## 2024-10-09 MED ORDER — ACETAMINOPHEN 500 MG PO TABS
500.0000 mg | ORAL_TABLET | Freq: Four times a day (QID) | ORAL | Status: DC
  Administered 2024-10-09 – 2024-10-10 (×4): 500 mg via ORAL
  Filled 2024-10-09 (×4): qty 1

## 2024-10-09 MED ORDER — METOPROLOL TARTRATE 5 MG/5ML IV SOLN
5.0000 mg | Freq: Four times a day (QID) | INTRAVENOUS | Status: DC | PRN
  Administered 2024-10-09: 5 mg via INTRAVENOUS
  Filled 2024-10-09: qty 5

## 2024-10-09 MED ORDER — HYDROCODONE-ACETAMINOPHEN 5-325 MG PO TABS
1.0000 | ORAL_TABLET | Freq: Once | ORAL | Status: AC
  Administered 2024-10-09: 1 via ORAL
  Filled 2024-10-09: qty 1

## 2024-10-09 MED ORDER — SODIUM CHLORIDE 0.9 % IV BOLUS
1000.0000 mL | Freq: Once | INTRAVENOUS | Status: AC
  Administered 2024-10-09: 1000 mL via INTRAVENOUS

## 2024-10-09 MED ORDER — DOCUSATE SODIUM 100 MG PO CAPS
100.0000 mg | ORAL_CAPSULE | Freq: Two times a day (BID) | ORAL | Status: DC
  Administered 2024-10-09 – 2024-10-10 (×3): 100 mg via ORAL
  Filled 2024-10-09 (×3): qty 1

## 2024-10-09 MED ORDER — THIAMINE MONONITRATE 100 MG PO TABS
100.0000 mg | ORAL_TABLET | Freq: Every day | ORAL | Status: DC
  Administered 2024-10-10: 100 mg via ORAL
  Filled 2024-10-09: qty 1

## 2024-10-09 MED ORDER — HYDROMORPHONE HCL 1 MG/ML IJ SOLN
1.0000 mg | INTRAMUSCULAR | Status: DC | PRN
  Administered 2024-10-09 (×3): 1 mg via INTRAVENOUS
  Filled 2024-10-09 (×3): qty 1

## 2024-10-09 MED ORDER — ACETAMINOPHEN 500 MG PO TABS: 1000.0000 mg | ORAL_TABLET | Freq: Four times a day (QID) | ORAL | Status: DC

## 2024-10-09 MED ORDER — POLYETHYLENE GLYCOL 3350 17 G PO PACK: 17.0000 g | PACK | Freq: Every day | ORAL | Status: DC | PRN

## 2024-10-09 MED ORDER — FOLIC ACID 1 MG PO TABS
1.0000 mg | ORAL_TABLET | Freq: Every day | ORAL | Status: DC
  Administered 2024-10-10: 1 mg via ORAL
  Filled 2024-10-09: qty 1

## 2024-10-09 MED ORDER — ADULT MULTIVITAMIN W/MINERALS CH
1.0000 | ORAL_TABLET | Freq: Every day | ORAL | Status: DC
  Administered 2024-10-10: 1 via ORAL
  Filled 2024-10-09: qty 1

## 2024-10-09 MED ORDER — ONDANSETRON 4 MG PO TBDP: 4.0000 mg | ORAL_TABLET | Freq: Four times a day (QID) | ORAL | Status: DC | PRN

## 2024-10-09 MED ORDER — ONDANSETRON HCL 4 MG/2ML IJ SOLN: 4.0000 mg | Freq: Four times a day (QID) | INTRAMUSCULAR | Status: DC | PRN

## 2024-10-09 MED ORDER — OXYCODONE HCL 5 MG PO TABS: 5.0000 mg | ORAL_TABLET | ORAL | Status: DC | PRN

## 2024-10-09 MED ORDER — LORAZEPAM 1 MG PO TABS: 1.0000 mg | ORAL_TABLET | ORAL | Status: DC | PRN

## 2024-10-09 MED ORDER — THIAMINE HCL 100 MG/ML IJ SOLN: 100.0000 mg | Freq: Every day | INTRAMUSCULAR | Status: DC

## 2024-10-09 MED ORDER — IOHEXOL 350 MG/ML SOLN
80.0000 mL | Freq: Once | INTRAVENOUS | Status: AC | PRN
  Administered 2024-10-09: 80 mL via INTRAVENOUS

## 2024-10-09 MED ORDER — HYDRALAZINE HCL 20 MG/ML IJ SOLN
10.0000 mg | INTRAMUSCULAR | Status: DC | PRN
  Administered 2024-10-09: 10 mg via INTRAVENOUS
  Filled 2024-10-09 (×2): qty 1

## 2024-10-09 MED ORDER — OXYCODONE HCL 5 MG PO TABS
10.0000 mg | ORAL_TABLET | ORAL | Status: DC | PRN
  Administered 2024-10-09 – 2024-10-10 (×5): 10 mg via ORAL
  Filled 2024-10-09 (×6): qty 2

## 2024-10-09 MED ORDER — LORAZEPAM 2 MG/ML IJ SOLN
1.0000 mg | INTRAMUSCULAR | Status: DC | PRN
  Administered 2024-10-09: 1 mg via INTRAVENOUS
  Administered 2024-10-10: 2 mg via INTRAVENOUS
  Filled 2024-10-09 (×2): qty 1
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
## 2024-10-09 NOTE — H&P (Signed)
 Consult/Admission Note  Zachary [name] 12/09/1980  [id number].    Requesting MD:  Dr. Maude Galloway, MD  Chief Complaint/Reason for Consult:  Fall from shower  HPI:  Zachary [name] is a 42 year old male with a past medical history of UC, HTN, and latent TB of the lung that presented to the ED via EMS after enduring a fall as he was exiting his shower. Patient states that he was stepping out of his shower and slipped. His right thoracic region hit the edge of the tub. He states that this pain has not been controlled, and this is what brought him to the ED. Patient states that he is unsure if he hit his head or lost consciousness. He reports that he had been drinking alcohol prior to this fall. He denies vision changes, dizziness, headaches, memory loss, SOB, chest pain, n/v.   Work up in the ED included the following labs and imaging: -CMP abnormalities  - Potassium 3.2, Chloride 97, glucose 162, Cr 1.28, Calcium 8.1, Albumin 3.3, AST 116, ALT 71, Anion gap 18 -CBC abnormalities - RBC 3.48, HGB 11.0, HCT 32.8 -Ethanol 257 -Protime-INR 17.1, 1.3  Ribs Unilateral W/ chest right completed showing trace right basilar pneumothorax. Minimally displaced right eighth and ninth rib fractures. Right lateral chest wall subcutaneous emphysema.  CT Head showed suboccipital abundant posttraumatic soft tissue gas. No acute traumatic injury identified in the head. Brain volume loss seems advanced for age. No acute intracranial abnormality.  CT cervical spine showed no acute traumatic injury identified in the cervical spine. Extensive posttraumatic soft tissue in the neck appears to be tracking cephalad from the chest.  CT Chest, Abd, Pelv shows multiple acute right rib fractures including the lateral aspects of the 8th and 9th ribs and a comminuted fracture of the posterior right 10th rib. Small right anterior and lateral pneumothorax extending over the anterior right lung base. Soft  tissue gas along the right lateral and posterior chest wall extending into the right lower neck and toward the midline of the posterior abdominal wall. Decompressed ascending and proximal transverse colon with apparent mucosal enhancement and wall thickening. This is consistent with the clinical history of ulcerative colitis.  Tobacco use: Former use of chewing tobacco Alcohol use: Denies daily use. Reports that he drinks alcohol only on the weekend. Illicit drug use: Denies Anticoagulation medications: Denies Medications: Rinvoq , Sertraline , Amlodipine    ROS: Per HPI  Family History  Problem Relation Age of Onset   COPD Mother    Healthy Father    Ovarian cancer Paternal Aunt    Colon cancer Cousin    Colon polyps Neg Hx    Esophageal cancer Neg Hx    Rectal cancer Neg Hx    Stomach cancer Neg Hx     Past Medical History:  Diagnosis Date   Allergy    Anxiety    Depression    Hypertension    Ulcerative colitis (HCC)    Ulcerative colitis (HCC) 02/12/2022    Past Surgical History:  Procedure Laterality Date   COLONOSCOPY      Social History:  reports that he has never smoked. He has been exposed to tobacco smoke. He has quit using smokeless tobacco.  His smokeless tobacco use included snuff. He reports current alcohol use. He reports that he does not use drugs.  Allergies: No Known Allergies  (Not in a hospital admission)   Blood pressure (!) 134/96, pulse 96, temperature 98.1 F (36.7 C), temperature source  Oral, resp. rate 18, height 6' (1.829 m), weight 108.9 kg, SpO2 97%. Physical Exam:  General: Pleasant male who is laying in bed in NAD. HEENT: Head is normocephalic, atraumatic.  Sclera are noninjected.  PERRL.  Ears and nose without any masses or lesions.  Mouth is pink and moist. Heart: HR normal during encounter. s1,s2. Palpable radial and pedal pulses bilaterally. Lungs: Lung sounds heard bilaterally. Respiratory effort nonlabored. Abd: Soft, NT, ND. +BS.  No rebound tenderness or guarding. MS: Able to move all 4 extremities.  Skin: Warm and dry. Bruising noted of the right thoracic region. Neuro: Speech is normal. Responds appropriately Psych: A&Ox3 with an appropriate affect.   Results for orders placed or performed during the hospital encounter of 10/09/24 (from the past 48 hours)  ABO/Rh     Status: None   Collection Time: 10/09/24  6:08 AM  Result Value Ref Range   ABO/RH(D)      O POS Performed at [facility], [address], [place], [place] [address]   Comprehensive metabolic panel     Status: Abnormal   Collection Time: 10/09/24  6:09 AM  Result Value Ref Range   Sodium 139 135 - 145 mmol/L   Potassium 3.2 (L) 3.5 - 5.1 mmol/L   Chloride 97 (L) 98 - 111 mmol/L   CO2 24 22 - 32 mmol/L   Glucose, Bld 162 (H) 70 - 99 mg/dL    Comment: Glucose reference range applies only to samples taken after fasting for at least 8 hours.   BUN 12 6 - 20 mg/dL   Creatinine, Ser 8.71 (H) 0.61 - 1.24 mg/dL   Calcium 8.1 (L) 8.9 - 10.3 mg/dL   Total Protein 6.6 6.5 - 8.1 g/dL   Albumin 3.3 (L) 3.5 - 5.0 g/dL   AST 883 (H) 15 - 41 U/L   ALT 71 (H) 0 - 44 U/L   Alkaline Phosphatase 89 38 - 126 U/L   Total Bilirubin 0.9 0.0 - 1.2 mg/dL   GFR, Estimated >39 >39 mL/min    Comment: (NOTE) Calculated using the CKD-EPI Creatinine Equation (2021)    Anion gap 18 (H) 5 - 15    Comment: Performed at [facility], [address], [place], [place] [address]  CBC with Differential     Status: Abnormal   Collection Time: 10/09/24  6:09 AM  Result Value Ref Range   WBC 4.8 4.0 - 10.5 K/uL   RBC 3.41 (L) 4.22 - 5.81 MIL/uL   Hemoglobin 11.0 (L) 13.0 - 17.0 g/dL   HCT 67.1 (L) 60.9 - 47.9 %   MCV 96.2 80.0 - 100.0 fL   MCH 32.3 26.0 - 34.0 pg   MCHC 33.5 30.0 - 36.0 g/dL   RDW 85.2 88.4 - 84.4 %   Platelets 303 150 - 400 K/uL   nRBC 0.0 0.0 - 0.2 %   Neutrophils Relative % 72 %   Neutro Abs 3.5 1.7 - 7.7 K/uL   Lymphocytes Relative  18 %   Lymphs Abs 0.9 0.7 - 4.0 K/uL   Monocytes Relative 8 %   Monocytes Absolute 0.4 0.1 - 1.0 K/uL   Eosinophils Relative 0 %   Eosinophils Absolute 0.0 0.0 - 0.5 K/uL   Basophils Relative 1 %   Basophils Absolute 0.0 0.0 - 0.1 K/uL   Immature Granulocytes 1 %   Abs Immature Granulocytes 0.03 0.00 - 0.07 K/uL    Comment: Performed at [facility],  [address], [place], [place] [address]  Ethanol     Status: Abnormal   Collection Time: 10/09/24  6:09 AM  Result Value Ref Range   Alcohol, Ethyl (B) 257 (H) <15 mg/dL    Comment: (NOTE) For medical purposes only. Performed at [facility], [address] [place], [place] [address]   Protime-INR     Status: Abnormal   Collection Time: 10/09/24  6:09 AM  Result Value Ref Range   Prothrombin Time 17.1 (H) 11.4 - 15.2 seconds   INR 1.3 (H) 0.8 - 1.2    Comment: (NOTE) INR goal varies based on device and disease states. Performed at [facility], [address], [place], [place] [address]   I-stat chem 8, ED (not at [facility], [facility] or [facility])     Status: Abnormal   Collection Time: 10/09/24  6:14 AM  Result Value Ref Range   Sodium 140 135 - 145 mmol/L   Potassium 3.3 (L) 3.5 - 5.1 mmol/L   Chloride 99 98 - 111 mmol/L   BUN 13 6 - 20 mg/dL   Creatinine, Ser 8.39 (H) 0.61 - 1.24 mg/dL   Glucose, Bld 835 (H) 70 - 99 mg/dL    Comment: Glucose reference range applies only to samples taken after fasting for at least 8 hours.   Calcium, Ion 0.98 (L) 1.15 - 1.40 mmol/L   TCO2 23 22 - 32 mmol/L   Hemoglobin 11.6 (L) 13.0 - 17.0 g/dL   HCT 65.9 (L) 60.9 - 47.9 %  Type and screen [facility]     Status: None   Collection Time: 10/09/24  6:28 AM  Result Value Ref Range   ABO/RH(D) O POS    Antibody Screen NEG    Sample Expiration      10/12/2024,2359 Performed at [facility], [address], [place], [place] [address]    CT CHEST ABDOMEN PELVIS W CONTRAST Result Date: 10/09/2024 EXAM: CT  CHEST, ABDOMEN AND PELVIS WITH CONTRAST 10/09/2024 06:57:00 AM TECHNIQUE: CT of the chest, abdomen and pelvis was performed with the administration of 80 mL of iohexol (OMNIPAQUE) 350 MG/ML injection. Multiplanar reformatted images are provided for review. Automated exposure control, iterative reconstruction, and/or weight based adjustment of the mA/kV was utilized to reduce the radiation dose to as low as reasonably achievable. COMPARISON: None available. CLINICAL HISTORY: Polytrauma, blunt. FINDINGS: CHEST: MEDIASTINUM AND LYMPH NODES: Mild cardiac enlargement. The main pulmonary artery measures 3.1 cm which may reflect mild Pulmonary Artery (PA) hypertension. Normal appearance of the thoracic aorta. No pericardial effusion. The central airways are clear. No mediastinal, hilar or axillary lymphadenopathy. LUNGS AND PLEURA: Trace right pleural thickening. Small right anterior and lateral pneumothorax extending over the anterior right lung base. Dependent ground glass attenuation is noted along the posterior right lower lobe. Scar versus atelectasis is noted within the right middle lobe and lateral left lower lobe. No focal consolidation. No pleural effusion. ABDOMEN AND PELVIS: LIVER: Widening of the falciform ligament and enlargement of the caudate lobe and lateral segment of the left lobe of the liver may be seen with early cirrhosis. No focal liver abnormality. No signs of liver injury or perihepatic fluid. GALLBLADDER AND BILE DUCTS: Gallbladder is unremarkable. No biliary ductal dilatation. SPLEEN: The spleen is within normal limits in size and appearance. PANCREAS: The pancreas is normal in size and contour without focal lesion or ductal dilatation. ADRENAL GLANDS: Normal size and morphology bilaterally. No nodule, thickening, or  hemorrhage. No periadrenal stranding. KIDNEYS, URETERS AND BLADDER: No stones in the kidneys or ureters. No hydronephrosis. No perinephric or periureteral stranding. Urinary bladder  is unremarkable. GI AND BOWEL: The appendix is visualized and normal in caliber, without wall thickening, periappendiceal inflammation, or fluid. The ascending and proximal transverse colon is decompressed with apparent mucosal enhancement and wall thickening. The wall thickening is nonspecific and may reflect incomplete distention versus underlying colitis. No signs of pneumatosis. Fat-containing right inguinal hernia. Stomach demonstrates no acute abnormality. There is no bowel obstruction. REPRODUCTIVE ORGANS: No acute abnormality. PERITONEUM AND RETROPERITONEUM: No free fluid or fluid collections within the abdomen or pelvis. No ascites. No free air. VASCULATURE: Aorta is normal in caliber. ABDOMINAL AND PELVIS LYMPH NODES: No lymphadenopathy. BONES AND SOFT TISSUES: There is soft tissue gas identified along the right lateral and posterior chest wall extending into the right neck and towards the midline of the posterior abdominal wall. Multiple acute right rib fractures are identified, including the lateral aspect of the 8th and 9th ribs. There is a comminuted fracture involving the posterior aspect of the right 10th rib. Chronic fractures involve the posterior 9th and 12th ribs. There is also a chronic fracture deformity involving a left rib. IMPRESSION: 1. Multiple acute right rib fractures including the lateral aspects of the 8th and 9th ribs and a comminuted fracture of the posterior right 10th rib. 2. Small right anterior and lateral pneumothorax extending over the anterior right lung base. 3. Soft tissue gas along the right lateral and posterior chest wall extending into the right lower neck and toward the midline of the posterior abdominal wall. 4. Decompressed ascending and proximal transverse colon with apparent mucosal enhancement and wall thickening. This is consistent with the clinical history of ulcerative colitis. Electronically signed by: Waddell Calk MD 10/09/2024 07:38 AM EST RP Workstation:  [id number]   CT Cervical Spine Wo Contrast Result Date: 10/09/2024 EXAM: CT CERVICAL SPINE WITHOUT CONTRAST 10/09/2024 06:44:00 AM TECHNIQUE: CT of the cervical spine was performed without the administration of intravenous contrast. Multiplanar reformatted images are provided for review. Automated exposure control, iterative reconstruction, and/or weight based adjustment of the mA/kV was utilized to reduce the radiation dose to as low as reasonably achievable. COMPARISON: Head and chest CTs today reported separately. CLINICAL HISTORY: Neck trauma, intoxicated or obtunded (Age >= 16y). FINDINGS: CERVICAL SPINE: BONES AND ALIGNMENT: Straightening and mild reversal of the normal cervical lordosis. Normal bone mineralization. 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 No acute fracture or traumatic malalignment. DEGENERATIVE CHANGES: Cervical chronic disc and endplate degeneration which is most pronounced at C4-C5 and C5-C6. Capacious spinal canal at most levels, despite cervical chronic disc and endplate degeneration. SOFT TISSUES: Abundant posttraumatic soft tissue gas in the neck, predominantly tracking in the posterior and lateral paraspinal muscle spaces, but also tracking in the right carotid space. Trace retropharyngeal space involvement. No parapharyngeal space involvement. Trace gas also tracking in the caudal right cervical spinal canal and at the cervicothoracic junction. No evidence of intraspinal hemorrhage by CT. Partially retropharyngeal course of the left carotid, normal variant. See associated chest CT abnormalities reported separately today. IMPRESSION: 1. No acute traumatic injury identified in the cervical spine. 2. Extensive posttraumatic soft tissue in the neck appears to be tracking cephalad from the Chest - see Abnormal Chest CT reported separately. Electronically signed by: Helayne Hurst MD 10/09/2024 07:36 AM EST RP Workstation: [id number]   CT Head Wo  Contrast Result Date: 10/09/2024 EXAM: CT HEAD WITHOUT  CONTRAST 10/09/2024 06:44:00 AM TECHNIQUE: CT of the head was performed without the administration of intravenous contrast. Automated exposure control, iterative reconstruction, and/or weight based adjustment of the mA/kV was utilized to reduce the radiation dose to as low as reasonably achievable. COMPARISON: CT Head 07/28/2024. CLINICAL HISTORY: 43 year old male with head trauma and pain after a fall, resulting in obtundation. FINDINGS: BRAIN AND VENTRICLES: No acute hemorrhage. No evidence of acute infarct. No hydrocephalus. No extra-axial collection. No mass effect or midline shift. Stable brain volume. 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 Supratentorial brain volume also seems at the lower limits of normal to mildly decreased for age. Gray white differentiation stable and within normal limits. No suspicious intracranial vascular hyperdensity. ORBITS: No acute abnormality. The orbit soft tissues appear negative. SINUSES: Middle ears and mastoids remain clear. Scattered mild to moderate bilateral paranasal sinus mucosal thickening, with low density sinus fluid levels in the bilateral maxillary sinuses which appear inflammatory. SOFT TISSUES AND SKULL: Graft posttraumatic soft tissue gas is abundant in the suboccipital soft tissue spaces. Superior scalp appears negative. No skull fracture. IMPRESSION: 1. Suboccipital abundant posttraumatic soft tissue gas. No acute traumatic injury identified in the head. 2. Brain volume loss seems advanced for age. No acute intracranial abnormality. Electronically signed by: Helayne Hurst MD 10/09/2024 07:33 AM EST RP Workstation: [id number]   DG Ribs Unilateral W/Chest Right Result Date: 10/09/2024 EXAM: AP VIEW XRAY OF THE RIGHT RIBS AND CHEST 10/09/2024 05:01:40 AM COMPARISON: None available. CLINICAL HISTORY: Right sided rib pain post fall. FINDINGS: BONES:  Minimally displaced right eighth and ninth rib fractures. LUNGS AND PLEURA: Low lung volumes. No consolidation or pulmonary edema. No pleural effusion. Trace right pneumothorax at lateral lung base. HEART AND MEDIASTINUM: No acute abnormality of the cardiac and mediastinal silhouettes. SOFT TISSUES: Subcutaneous emphysema along right lateral chest wall. IMPRESSION: 1. Trace right basilar pneumothorax. 2. Minimally displaced right eighth and ninth rib fractures. 3. Right lateral chest wall subcutaneous emphysema. 4. Critical results were called to the ordering provider Ubaldo High at the time of interpretation on 10/09/2024. Electronically signed by: Waddell Calk MD 10/09/2024 06:07 AM EST RP Workstation: [id number]      Assessment/Plan Fall from shower  Rib fractures - Multimodal pain management, IS, Pulm toilet Right Pneumothorax - Repeat xray in the AM  Imaging reviewed with attending.  Admit to trauma service  FEN: NPO currently VTE: Enoxaparin 40 mg daily  ID: None currently   I reviewed EDP notes, specialist notes, nursing notes, last 24 h vitals and pain scores, last 48 h intake and output, last 24 h labs and trends, and last 24 h imaging results.  This care required high  level of medical decision making.   Marjorie Carlyon Favre, [facility] 10/09/2024, 12:38 PM Please see Amion for pager number during day hours 7:00am-4:30pm

## 2024-10-09 NOTE — TOC CAGE-AID Note (Signed)
 Transition of Care [facility]) - CAGE-AID Screening  Patient Details  Name: Zachary [name] MRN: [id number] Date of Birth: 19-Mar-1981  Clinical Narrative:  Patient endorses occasional alcohol use, on the weekends (Fri-Sun) usually 2-3 vodka orange juice. Patient verbalizes never experiencing withdrawal symptoms and doesn't think he will have an issue not drinking. He states I don't want to drink anymore anyway. Patient denies any illicit drug use or cigarette smoking. Alcohol abuse resources offered to patient but he denies need at this time.  CAGE-AID Screening:   Have You Ever Felt You Ought to Cut Down on Your Drinking or Drug Use?: No Have People Annoyed You By Critizing Your Drinking Or Drug Use?: No Have You Felt Bad Or Guilty About Your Drinking Or Drug Use?: No Have You Ever Had a Drink or Used Drugs First Thing In The Morning to Steady Your Nerves or to Get Rid of a Hangover?: No CAGE-AID Score: 0  Substance Abuse Education Offered: Yes

## 2024-10-09 NOTE — ED Triage Notes (Signed)
 Pt from home via EMS.  Pt reports slipping and falling in shower, landing on R ribs.  Pt denies blood thinners, hitting head, LOC.  No obvious deformity.  Pt reports pain in R ribs.

## 2024-10-09 NOTE — ED Provider Notes (Signed)
 [place] EMERGENCY DEPARTMENT AT [facility] Provider Note   CSN: [id number] Arrival date & time: 10/09/24  0416     Patient presents with: Zachary   Hawkins [name] is a 43 y.o. male.  Patient with past medical history seen over UC, hypertension, latent TB of the lung presents to the emergency department via EMS complaining of right sided rib pain.  Patient reportedly slipped in the shower falling, hitting the right side of his ribs.  He denies shortness of breath but states it hurts to speak, laugh, take a deep breath.  EMS reports reassuring SpO2 of 96+ percent.    Fall       Prior to Admission medications   Medication Sig Start Date End Date Taking? Authorizing Provider  albuterol (VENTOLIN HFA) 108 (90 Base) MCG/ACT inhaler Inhale 2 puffs into the lungs every 6 (six) hours as needed for wheezing or shortness of breath. 10/03/24   Almarie Waddell NOVAK, NP  amLODipine  (NORVASC ) 10 MG tablet Take 1 tablet (10 mg total) by mouth daily. 05/10/24   Almarie Waddell NOVAK, NP  azithromycin (ZITHROMAX) 250 MG tablet Take 2 tablets (500 mg total) by mouth daily for 1 day, THEN 1 tablet (250 mg total) daily for 4 days. 09/30/24 10/09/24  Joesph Shaver Scales, PA-C  guaifenesin (HUMIBID E) 400 MG TABS tablet Take 1 tablet 3 times daily as needed for chest congestion and cough 09/30/24   Joesph Shaver Scales, PA-C  Multiple Vitamin (MULTI-VITAMIN) tablet Take 1 tablet by mouth daily.    [provider]  promethazine-dextromethorphan (PROMETHAZINE-DM) 6.25-15 MG/5ML syrup Take 5 mLs by mouth at bedtime as needed for cough. 09/30/24   Joesph Shaver Scales, PA-C  pyridOXINE  (B-6) 50 MG tablet Take 1 tablet (50 mg total) by mouth daily. 04/18/22   Comer, Lamar ORN, MD  sertraline  (ZOLOFT ) 100 MG tablet Take 2 tablets (200 mg total) by mouth daily. 09/01/24   Almarie Waddell NOVAK, NP  Upadacitinib  ER (RINVOQ ) 30 MG TB24 Take 1 tablet (30 mg total) by mouth daily. 05/09/24   May, Deanna J, NP     Allergies: Patient has no known allergies.    Review of Systems  Updated Vital Signs BP 111/79   Pulse 87   Temp 98.2 F (36.8 C) (Oral)   Resp 20   Ht 6' (1.829 m)   Wt 108.9 kg   SpO2 95%   BMI 32.55 kg/m   Physical Exam Vitals and nursing note reviewed.  HENT:     Head: Normocephalic and atraumatic.  Eyes:     Pupils: Pupils are equal, round, and reactive to light.  Cardiovascular:     Rate and Rhythm: Normal rate and regular rhythm.  Pulmonary:     Effort: Pulmonary effort is normal. No respiratory distress.     Breath sounds: Normal breath sounds.  Musculoskeletal:        General: Tenderness and signs of injury present.     Cervical back: Normal range of motion.     Comments: Right sided chest wall tenderness.  No crepitus, paradoxical motion, deformity appreciated  Skin:    General: Skin is dry.  Neurological:     Mental Status: He is alert.  Psychiatric:        Speech: Speech normal.        Behavior: Behavior normal.     (all labs ordered are listed, but only abnormal results are displayed) Labs Reviewed  I-STAT CHEM 8, ED - Abnormal; Notable for the following  components:      Result Value   Potassium 3.3 (*)    Creatinine, Ser 1.60 (*)    Glucose, Bld 164 (*)    Calcium, Ion 0.98 (*)    Hemoglobin 11.6 (*)    HCT 34.0 (*)    All other components within normal limits  COMPREHENSIVE METABOLIC PANEL WITH GFR  CBC WITH DIFFERENTIAL/PLATELET  ETHANOL  PROTIME-INR  ABO/RH  TYPE AND SCREEN    EKG: None  Radiology: DG Ribs Unilateral W/Chest Right Result Date: 10/09/2024 EXAM: AP VIEW XRAY OF THE RIGHT RIBS AND CHEST 10/09/2024 05:01:40 AM COMPARISON: None available. CLINICAL HISTORY: Right sided rib pain post fall. FINDINGS: BONES: Minimally displaced right eighth and ninth rib fractures. LUNGS AND PLEURA: Low lung volumes. No consolidation or pulmonary edema. No pleural effusion. Trace right pneumothorax at lateral lung base. HEART AND  MEDIASTINUM: No acute abnormality of the cardiac and mediastinal silhouettes. SOFT TISSUES: Subcutaneous emphysema along right lateral chest wall. IMPRESSION: 1. Trace right basilar pneumothorax. 2. Minimally displaced right eighth and ninth rib fractures. 3. Right lateral chest wall subcutaneous emphysema. 4. Critical results were called to the ordering provider Ubaldo High at the time of interpretation on 10/09/2024. Electronically signed by: Waddell Calk MD 10/09/2024 06:07 AM EST RP Workstation: [id number]     .Critical Care  Performed by: High Ubaldo NOVAK, PA-C Authorized by: High Ubaldo NOVAK, PA-C   Critical care provider statement:    Critical care time (minutes):  30   Critical care time was exclusive of:  Separately billable procedures and treating other patients   Critical care was necessary to treat or prevent imminent or life-threatening deterioration of the following conditions:  Trauma   Critical care was time spent personally by me on the following activities:  Development of treatment plan with patient or surrogate, discussions with consultants, evaluation of patient's response to treatment, examination of patient, ordering and review of laboratory studies, ordering and review of radiographic studies, ordering and performing treatments and interventions, pulse oximetry, re-evaluation of patient's condition and review of old charts    Medications Ordered in the ED  HYDROcodone-acetaminophen (NORCO/VICODIN) 5-325 MG per tablet 1 tablet (1 tablet Oral Given 10/09/24 0531)                                    Medical Decision Making Amount and/or Complexity of Data Reviewed Labs: ordered. Radiology: ordered.  Risk Prescription drug management.   This patient presents to the ED for concern of posttraumatic chest wall pain, this involves an extensive number of treatment options, and is a complaint that carries with it a high risk of complications and morbidity.  The  differential diagnosis includes fracture, muscle strain, dislocation, others   Co morbidities / Chronic conditions that complicate the patient evaluation  As noted in HPI   Additional history obtained:  Additional history obtained from EMR and EMS  Lab Tests:  I Ordered, and personally interpreted labs.  The pertinent results include:  Creatinine 1.6   Imaging Studies ordered:  I ordered imaging studies including plain films of the ribs I independently visualized and interpreted imaging which showed  1. Trace right basilar pneumothorax.  2. Minimally displaced right eighth and ninth rib fractures.  3. Right lateral chest wall subcutaneous emphysema.   I agree with the radiologist interpretation CT scans ordered after I was notified of plain film results.  CT of the head and  cervical spine ordered.  CT of the chest abdomen pelvis with contrast ordered   Problem List / ED Course / Critical interventions / Medication management   I ordered medication including Norco Reevaluation of the patient after these medicines showed that the patient improved I have reviewed the patients home medicines and have made adjustments as needed   Social Determinants of Health:  Patient has Medicaid for his primary health insurance type   Test / Admission - Considered:  Patient with evidence of minimally displaced right 8th and 9th rib fractures with a small basilar pneumothorax and right sided lateral chest wall subcutaneous emphysema.  Patient needs CT imaging for further evaluation of his imaging to rule out any intra-abdominal or further intrathoracic injuries and to rule out any head or cervical spine injury.  Advanced imaging has been ordered along with trauma based labs.  He does endorse drinking alcohol this evening which likely contributed to his fall.  Patient care being transferred to Sherlean Carota, PA-C at shift handoff      Final diagnoses:  Fall, initial encounter   Closed fracture of multiple ribs of right side, initial encounter  Traumatic pneumothorax, initial encounter    ED Discharge Orders     None          Logan Ubaldo KATHEE DEVONNA 10/09/24 [id number]    Midge Golas, MD 10/09/24 [phone]

## 2024-10-10 ENCOUNTER — Other Ambulatory Visit (HOSPITAL_COMMUNITY): Payer: Self-pay

## 2024-10-10 ENCOUNTER — Inpatient Hospital Stay (HOSPITAL_COMMUNITY)

## 2024-10-10 DIAGNOSIS — S270XXA Traumatic pneumothorax, initial encounter: Secondary | ICD-10-CM | POA: Diagnosis not present

## 2024-10-10 DIAGNOSIS — S2241XA Multiple fractures of ribs, right side, initial encounter for closed fracture: Secondary | ICD-10-CM | POA: Diagnosis not present

## 2024-10-10 DIAGNOSIS — T797XXA Traumatic subcutaneous emphysema, initial encounter: Secondary | ICD-10-CM | POA: Diagnosis not present

## 2024-10-10 DIAGNOSIS — R918 Other nonspecific abnormal finding of lung field: Secondary | ICD-10-CM | POA: Diagnosis not present

## 2024-10-10 LAB — BASIC METABOLIC PANEL WITH GFR
Anion gap: 16 — ABNORMAL HIGH (ref 5–15)
BUN: 9 mg/dL (ref 6–20)
CO2: 25 mmol/L (ref 22–32)
Calcium: 7.4 mg/dL — ABNORMAL LOW (ref 8.9–10.3)
Chloride: 95 mmol/L — ABNORMAL LOW (ref 98–111)
Creatinine, Ser: 0.99 mg/dL (ref 0.61–1.24)
GFR, Estimated: >60 mL/min (ref >60)
Glucose, Bld: 119 mg/dL — ABNORMAL HIGH (ref 70–99)
Potassium: 3 mmol/L — ABNORMAL LOW (ref 3.5–5.1)
Sodium: 136 mmol/L (ref 135–145)

## 2024-10-10 LAB — CBC
HCT: 32.9 % — ABNORMAL LOW (ref 39.0–52.0)
Hemoglobin: 11.2 g/dL — ABNORMAL LOW (ref 13.0–17.0)
MCH: 32.4 pg (ref 26.0–34.0)
MCHC: 34 g/dL (ref 30.0–36.0)
MCV: 95.1 fL (ref 80.0–100.0)
Platelets: 227 K/uL (ref 150–400)
RBC: 3.46 MIL/uL — ABNORMAL LOW (ref 4.22–5.81)
RDW: 14.9 % (ref 11.5–15.5)
WBC: 5.7 K/uL (ref 4.0–10.5)
nRBC: 0 % (ref 0.0–0.2)

## 2024-10-10 MED ORDER — ACETAMINOPHEN 500 MG PO TABS
500.0000 mg | ORAL_TABLET | Freq: Four times a day (QID) | ORAL | 0 refills | Status: DC
  Filled 2024-10-10: qty 30, 8d supply, fill #0

## 2024-10-10 MED ORDER — METHOCARBAMOL 500 MG PO TABS
750.0000 mg | ORAL_TABLET | Freq: Three times a day (TID) | ORAL | 0 refills | Status: DC
  Filled 2024-10-10: qty 40, 9d supply, fill #0

## 2024-10-10 MED ORDER — POLYETHYLENE GLYCOL 3350 17 GM/SCOOP PO POWD
17.0000 g | Freq: Every day | ORAL | 0 refills | Status: AC | PRN
  Filled 2024-10-10: qty 238, 14d supply, fill #0

## 2024-10-10 MED ORDER — DOCUSATE SODIUM 100 MG PO CAPS
100.0000 mg | ORAL_CAPSULE | Freq: Two times a day (BID) | ORAL | 0 refills | Status: DC
  Filled 2024-10-10: qty 10, 5d supply, fill #0

## 2024-10-10 MED ORDER — AMLODIPINE BESYLATE 10 MG PO TABS: 10.0000 mg | ORAL_TABLET | Freq: Every day | ORAL | Status: DC

## 2024-10-10 MED ORDER — POTASSIUM CHLORIDE CRYS ER 20 MEQ PO TBCR
80.0000 meq | EXTENDED_RELEASE_TABLET | Freq: Once | ORAL | Status: AC
  Administered 2024-10-10: 80 meq via ORAL
  Filled 2024-10-10: qty 4

## 2024-10-10 MED ORDER — OXYCODONE HCL 5 MG PO TABS
5.0000 mg | ORAL_TABLET | ORAL | 0 refills | Status: DC | PRN
  Filled 2024-10-10: qty 20, 4d supply, fill #0

## 2024-10-10 NOTE — TOC Transition Note (Signed)
 Transition of Care [facility]) - Discharge Note   Patient Details  Name: Zachary [name] MRN: [id number] Date of Birth: 05/14/1981  Transition of Care [facility]) CM/SW Contact:  Spiro Ausborn M, RN Phone Number: 10/10/2024, 12:25 PM   Clinical Narrative:    43 year old male presented to the ED via EMS after enduring a fall as he was exiting his shower. R 8-10 rib fxs, trace pneumothorax.   PTA, pt independent and living at home alone; PT/OT recommending no OP follow up or DME.  Patient states he has limited support and needs tranportation home.  Provided cab voucher and transportation waiver signed and placed in chart.     Final next level of care: Home/Self Care                                Discharge Plan and Services Additional resources added to the After Visit Summary for     Discharge Planning Services: CM Consult                                 Social Drivers of Health (SDOH) Interventions SDOH Screenings   Food Insecurity: No Food Insecurity (10/09/2024)  Housing: High Risk (10/09/2024)  Transportation Needs: No Transportation Needs (10/09/2024)  Utilities: Not At Risk (10/09/2024)  Alcohol Screen: High Risk (05/03/2024)  Depression (PHQ2-9): High Risk (05/10/2024)  Financial Resource Strain: Low Risk  (05/03/2024)  Physical Activity: Unknown (05/03/2024)  Social Connections: Socially Isolated (05/03/2024)  Stress: No Stress Concern Present (05/03/2024)  Tobacco Use: Medium Risk (10/09/2024)     Readmission Risk Interventions     No data to display         [name], RN, BSN  Trauma/Neuro ICU Case Manager [phone]

## 2024-10-10 NOTE — Discharge Summary (Signed)
 [facility] Discharge Summary   Patient ID: Zachary [name] MRN: [id number] DOB/AGE: 05-03-1981 43 y.o.  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 Rib fracture. FINDINGS: LUNGS AND PLEURA: Mild bibasilar opacity as regressed and most resembles atelectasis. Stable lung volumes. No areas of worsening ventilation. No pulmonary edema. No pleural effusion. No pneumothorax. HEART AND MEDIASTINUM: No acute abnormality of the cardiac and mediastinal silhouettes. BONES AND SOFT TISSUES: Right rib fractures. Subcutaneous gas in the lower neck and right chest wall appears regressed. IMPRESSION: 1. No pneumothorax identified now. Subcutaneous emphysema in the lower neck and right chest wall has decreased. 2. Mild bibasilar opacity is improved, favor atelectasis. 3. Right rib fractures. Electronically signed by: Helayne Hurst MD 10/10/2024 06:29 AM EST RP Workstation: [id number]   DG CHEST PORT 1 VIEW Result Date: 10/09/2024 EXAM: 1 VIEW(S) XRAY OF THE CHEST 10/09/2024 12:01:00 PM COMPARISON: CT of earlier today chest radiograph of earlier today. CLINICAL HISTORY: Pneumothorax FINDINGS: LUNGS AND PLEURA: Increased bibasilar airspace disease. No pulmonary edema. No pleural effusion. May be trace inferior right pleural air along the right hemidiaphragm. HEART AND MEDIASTINUM: No acute abnormality of the cardiac and mediastinal  silhouettes. BONES AND SOFT TISSUES: Emphysema about the right chest wall and right greater than left neck, increased. No acute osseous abnormality. IMPRESSION: 1. Increased subcutaneous emphysema about the right chest wall with possible trace right sided subpulmonic pleural air. 2. Worsened bibasilar airspace disease, developing atelectasis. Electronically signed by: Rockey Kilts MD 10/09/2024 12:37 PM EST RP Workstation: [id number]   CT CHEST ABDOMEN PELVIS W CONTRAST Result Date: 10/09/2024 EXAM: CT CHEST, ABDOMEN AND PELVIS WITH CONTRAST 10/09/2024 06:57:00 AM TECHNIQUE: CT of the chest, abdomen and pelvis was performed with the administration of 80 mL of iohexol (OMNIPAQUE) 350 MG/ML injection. Multiplanar reformatted images are provided for review. Automated exposure control, iterative reconstruction, and/or weight based adjustment of the mA/kV was utilized to reduce the radiation dose to as low as reasonably achievable. COMPARISON: None available. CLINICAL HISTORY: Polytrauma, blunt. FINDINGS: CHEST: MEDIASTINUM AND LYMPH NODES: Mild cardiac enlargement. The main pulmonary artery measures 3.1 cm which may reflect mild Pulmonary Artery (PA) hypertension. Normal appearance of the thoracic aorta. No pericardial effusion. The central airways are clear. No mediastinal, hilar or axillary lymphadenopathy. LUNGS AND PLEURA: Trace right pleural thickening. Small right anterior and lateral pneumothorax extending over the anterior right lung base. Dependent ground glass attenuation is noted along the posterior right lower lobe. Scar versus atelectasis is noted within the right middle lobe and lateral left lower lobe. No focal consolidation. No pleural effusion. ABDOMEN AND PELVIS: LIVER: Widening of the falciform ligament and enlargement of the caudate lobe and lateral segment of the left lobe of the liver may be seen with early cirrhosis. No focal liver abnormality. No signs of liver injury or perihepatic fluid.  GALLBLADDER AND BILE DUCTS: Gallbladder is unremarkable. No biliary ductal dilatation. SPLEEN: The spleen is within normal limits in size and appearance. PANCREAS: The pancreas is  normal in size and contour without focal lesion or ductal dilatation. ADRENAL GLANDS: Normal size and morphology bilaterally. No nodule, thickening, or hemorrhage. No periadrenal stranding. KIDNEYS, URETERS AND BLADDER: No stones in the kidneys or ureters. No hydronephrosis. No perinephric or periureteral stranding. Urinary bladder is unremarkable. GI AND BOWEL: The appendix is visualized and normal in caliber, without wall thickening, periappendiceal inflammation, or fluid. The ascending and proximal transverse colon is decompressed with apparent mucosal enhancement and wall thickening. The wall thickening is nonspecific and may reflect incomplete distention versus underlying colitis. No signs of pneumatosis. Fat-containing right inguinal hernia. Stomach demonstrates no acute abnormality. There is no bowel obstruction. REPRODUCTIVE ORGANS: No acute abnormality. PERITONEUM AND RETROPERITONEUM: No free fluid or fluid collections within the abdomen or pelvis. No ascites. No free air. VASCULATURE: Aorta is normal in caliber. ABDOMINAL AND PELVIS LYMPH NODES: No lymphadenopathy. BONES AND SOFT TISSUES: There is soft tissue gas identified along the right lateral and posterior chest wall extending into the right neck and towards the midline of the posterior abdominal wall. Multiple acute right rib fractures are identified, including the lateral aspect of the 8th and 9th ribs. There is a comminuted fracture involving the posterior aspect of the right 10th rib. Chronic fractures involve the posterior 9th and 12th ribs. There is also a chronic fracture deformity involving a left rib. IMPRESSION: 1. Multiple acute right rib fractures including the lateral aspects of the 8th and 9th ribs and a comminuted fracture of the posterior right 10th rib. 2.  Small right anterior and lateral pneumothorax extending over the anterior right lung base. 3. Soft tissue gas along the right lateral and posterior chest wall extending into the right lower neck and toward the midline of the posterior abdominal wall. 4. Decompressed ascending and proximal transverse colon with apparent mucosal enhancement and wall thickening. This is consistent with the clinical history of ulcerative colitis. Electronically signed by: Waddell Calk MD 10/09/2024 07:38 AM EST RP Workstation: [id number]   CT Cervical Spine Wo Contrast Result Date: 10/09/2024 EXAM: CT CERVICAL SPINE WITHOUT CONTRAST 10/09/2024 06:44:00 AM TECHNIQUE: CT of the cervical spine was performed without the administration of intravenous contrast. Multiplanar reformatted images are provided for review. Automated exposure control, iterative reconstruction, and/or weight based adjustment of the mA/kV was utilized to reduce the radiation dose to as low as reasonably achievable. COMPARISON: Head and chest CTs today reported separately. CLINICAL HISTORY: Neck trauma, intoxicated or obtunded (Age >= 16y). FINDINGS: CERVICAL SPINE: BONES AND ALIGNMENT: Straightening and mild reversal of the normal cervical lordosis. Normal bone mineralization. 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 No acute fracture or traumatic malalignment. DEGENERATIVE CHANGES: Cervical chronic disc and endplate degeneration which is most pronounced at C4-C5 and C5-C6. Capacious spinal canal at most levels, despite cervical chronic disc and endplate degeneration. SOFT TISSUES: Abundant posttraumatic soft tissue gas in the neck, predominantly tracking in the posterior and lateral paraspinal muscle spaces, but also tracking in the right carotid space. Trace retropharyngeal space involvement. No parapharyngeal space involvement. Trace gas also tracking in the caudal right cervical spinal canal and at the cervicothoracic  junction. No evidence of intraspinal hemorrhage by CT. Partially retropharyngeal course of the left carotid, normal variant. See associated chest CT abnormalities reported separately today. IMPRESSION: 1. No acute traumatic injury identified in the cervical spine. 2. Extensive posttraumatic soft tissue in the neck appears to be tracking cephalad from the Chest - see Abnormal Chest CT reported separately. Electronically signed by: Helayne Hurst  MD 10/09/2024 07:36 AM EST RP Workstation: [id number]   CT Head Wo Contrast Result Date: 10/09/2024 EXAM: CT HEAD WITHOUT CONTRAST 10/09/2024 06:44:00 AM TECHNIQUE: CT of the head was performed without the administration of intravenous contrast. Automated exposure control, iterative reconstruction, and/or weight based adjustment of the mA/kV was utilized to reduce the radiation dose to as low as reasonably achievable. COMPARISON: CT Head 07/28/2024. CLINICAL HISTORY: 43 year old male with head trauma and pain after a fall, resulting in obtundation. FINDINGS: BRAIN AND VENTRICLES: No acute hemorrhage. No evidence of acute infarct. No hydrocephalus. No extra-axial collection. No mass effect or midline shift. Stable brain volume. 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 Supratentorial brain volume also seems at the lower limits of normal to mildly decreased for age. Gray white differentiation stable and within normal limits. No suspicious intracranial vascular hyperdensity. ORBITS: No acute abnormality. The orbit soft tissues appear negative. SINUSES: Middle ears and mastoids remain clear. Scattered mild to moderate bilateral paranasal sinus mucosal thickening, with low density sinus fluid levels in the bilateral maxillary sinuses which appear inflammatory. SOFT TISSUES AND SKULL: Graft posttraumatic soft tissue gas is abundant in the suboccipital soft tissue spaces. Superior scalp appears negative. No skull fracture.  IMPRESSION: 1. Suboccipital abundant posttraumatic soft tissue gas. No acute traumatic injury identified in the head. 2. Brain volume loss seems advanced for age. No acute intracranial abnormality. Electronically signed by: Helayne Hurst MD 10/09/2024 07:33 AM EST RP Workstation: [id number]   DG Ribs Unilateral W/Chest Right Result Date: 10/09/2024 EXAM: AP VIEW XRAY OF THE RIGHT RIBS AND CHEST 10/09/2024 05:01:40 AM COMPARISON: None available. CLINICAL HISTORY: Right sided rib pain post fall. FINDINGS: BONES: Minimally displaced right eighth and ninth rib fractures. LUNGS AND PLEURA: Low lung volumes. No consolidation or pulmonary edema. No pleural effusion. Trace right pneumothorax at lateral lung base. HEART AND MEDIASTINUM: No acute abnormality of the cardiac and mediastinal silhouettes. SOFT TISSUES: Subcutaneous emphysema along right lateral chest wall. IMPRESSION: 1. Trace right basilar pneumothorax. 2. Minimally displaced right eighth and ninth rib fractures. 3. Right lateral chest wall subcutaneous emphysema. 4. Critical results were called to the ordering provider Ubaldo High at the time of interpretation on 10/09/2024. Electronically signed by: Waddell Calk MD 10/09/2024 06:07 AM EST RP Workstation: [id number]    Procedures none   HPI:  Zachary [name] is a 43 year old male with a past medical history of UC, HTN, and latent TB of the lung that presented to the ED via EMS after enduring a fall as he was exiting his shower. Patient states that he was stepping out of his shower and slipped. His right thoracic region hit the edge of the tub. He states that this pain has not been controlled, and this is what brought him to the ED. Patient states that he is unsure if he hit his head or lost consciousness. He reports that he had been drinking alcohol prior to this fall. He denies vision changes, dizziness, headaches, memory loss, SOB, chest pain, n/v.   Hospital Course:  Trauma workup  significant for the below injuries along with their management: Fall from shower Rib fractures, right, 8-10 - Multimodal pain management, IS, Pulm toilet Right Pneumothorax - repeat chest x-ray on 11/10 without visible pneumothorax. Oxygenating on room air with normal work of breathing. HTN - home norvasc  UC- on Rinvoq  Anxiety - on sertraline    Patient was observed at the hospital. He worked with PT/OT who recommended no follow up. On 11/10  his pain was controlled and he is stable for discharge.   Follow up as below with PCP.   I have personally reviewed the patients medication history on the [place] controlled substance database.    Physical Exam: General:  Alert, NAD, pleasant, comfortable Pulm: normal effort, CTAB, appropraitely tender over posterior-lateral chest wall  Abd:  Soft, ND, nontender Ext: no deformity or swelling  Allergies as of 10/10/2024   No Known Allergies      Medication List     STOP taking these medications    azithromycin 250 MG tablet Commonly known as: ZITHROMAX       TAKE these medications    acetaminophen 500 MG tablet Commonly known as: TYLENOL Take 1 tablet (500 mg total) by mouth every 6 (six) hours.   albuterol 108 (90 Base) MCG/ACT inhaler Commonly known as: VENTOLIN HFA Inhale 2 puffs into the lungs every 6 (six) hours as needed for wheezing or shortness of breath.   amLODipine  10 MG tablet Commonly known as: NORVASC  Take 1 tablet (10 mg total) by mouth daily.   docusate sodium 100 MG capsule Commonly known as: COLACE Take 1 capsule (100 mg total) by mouth 2 (two) times daily.   guaifenesin 400 MG Tabs tablet Commonly known as: HUMIBID E Take 1 tablet 3 times daily as needed for chest congestion and cough   methocarbamol 500 MG tablet Commonly known as: ROBAXIN Take 1.5 tablets (750 mg total) by mouth 3 (three) times daily.   Multi-Vitamin tablet Take 1 tablet by mouth daily.   oxyCODONE 5 MG immediate release  tablet Commonly known as: Oxy IR/ROXICODONE Take 1 tablet (5 mg total) by mouth every 4 (four) hours as needed for moderate pain (pain score 4-6).   polyethylene glycol powder 17 GM/SCOOP powder Commonly known as: GLYCOLAX/MIRALAX Take 17 g by mouth daily as needed (constipation). Dissolve 1 capful (17g) in 4-8 ounces of liquid and take by mouth daily.   promethazine-dextromethorphan 6.25-15 MG/5ML syrup Commonly known as: PROMETHAZINE-DM Take 5 mLs by mouth at bedtime as needed for cough.   pyridOXINE  50 MG tablet Commonly known as: VITAMIN B6 Take 1 tablet (50 mg total) by mouth daily.   Rinvoq  30 MG Tb24 Generic drug: Upadacitinib  ER Take 1 tablet (30 mg total) by mouth daily.   sertraline  100 MG tablet Commonly known as: ZOLOFT  Take 2 tablets (200 mg total) by mouth daily.          Follow-up Information     Almarie Waddell NOVAK, NP. Schedule an appointment as soon as possible for a visit in 1 week(s).   Specialties: Family Medicine, Emergency Medicine Why: for post-hospital follow up Contact information: [address] [place] [address] [phone]                 Signed: Almarie Pringle, [facility] 10/10/2024, 12:18 PM

## 2024-10-10 NOTE — Plan of Care (Signed)
   Problem: Education: Goal: Knowledge of General Education information will improve Description Including pain rating scale, medication(s)/side effects and non-pharmacologic comfort measures Outcome: Progressing   Problem: Health Behavior/Discharge Planning: Goal: Ability to manage health-related needs will improve Outcome: Progressing

## 2024-10-10 NOTE — Evaluation (Signed)
 Occupational Therapy Evaluation Patient Details Name: Zachary [name] MRN: [id number] DOB: [birth year] Today's Date: 10/10/2024   History of Present Illness   43 year old male presented to the ED via EMS after enduring a fall as he was exiting his shower. R 8-10 rib fxs, trace pneumothorax, PMH- UC, HTN, and latent TB of the lung     Clinical Impressions [place] was evaluated s/p the above admission list. He lives alone and is indep at baseline. Upon evaluation the pt was limited by R flank pain, unsteady balance with several LOBs, SOB with activity, HR in the 130s with ADLs and decreased activity tolerance. Overall he needed min A for mobility without the RW and contact guard for functional mobility with the RW. Due to the deficits listed below the pt also needs set up A for ADLs with cues for pain and RUE management. Pt agreeable to use RW at home and avoid tub transfers until his balance is improved. Pt will benefit from continued acute OT services to discharge home without follow up OT needed.       If plan is discharge home, recommend the following:   A little help with walking and/or transfers;A little help with bathing/dressing/bathroom;Assistance with cooking/housework;Assist for transportation;Help with stairs or ramp for entrance     Functional Status Assessment   Patient has had a recent decline in their functional status and demonstrates the ability to make significant improvements in function in a reasonable and predictable amount of time.     Equipment Recommendations   Other (comment) (RW)     Recommendations for Other Services         Precautions/Restrictions   Precautions Precautions: Fall Restrictions Weight Bearing Restrictions Per Provider Order: No     Mobility Bed Mobility Overal bed mobility: Needs Assistance Bed Mobility: Supine to Sit     Supine to sit: Supervision          Transfers Overall transfer level: Needs assistance    Transfers: Sit to/from Stand Sit to Stand: Contact guard assist           General transfer comment: stoodf without AD but needed RW for balance during functional mobility      Balance Overall balance assessment: Needs assistance Sitting-balance support: Feet supported Sitting balance-Leahy Scale: Good     Standing balance support: No upper extremity supported, During functional activity Standing balance-Leahy Scale: Poor Standing balance comment: needed RW for balance                           ADL either performed or assessed with clinical judgement   ADL Overall ADL's : Needs assistance/impaired Eating/Feeding: Independent   Grooming: Contact guard assist;Standing   Upper Body Bathing: Set up;Sitting   Lower Body Bathing: Contact guard assist;Sit to/from stand   Upper Body Dressing : Set up;Sitting Upper Body Dressing Details (indicate cue type and reason): cues for RUE in first out last for pain management Lower Body Dressing: Contact guard assist;Sit to/from stand   Toilet Transfer: Contact guard assist;Ambulation;Rolling walker (2 wheels) Toilet Transfer Details (indicate cue type and reason): pt had multiple LOBs, RW increased safety Toileting- Clothing Manipulation and Hygiene: Supervision/safety;Sit to/from stand;Sitting/lateral lean       Functional mobility during ADLs: Contact guard assist;Rolling walker (2 wheels) General ADL Comments: limited by R flank pain and unsteady balance     Vision Baseline Vision/History: 0 No visual deficits Vision Assessment?: No apparent visual deficits  Perception Perception: Within Functional Limits       Praxis Praxis: WFL       Pertinent Vitals/Pain Pain Assessment Pain Assessment: 0-10 Pain Score: 5  Pain Location: R flank Pain Descriptors / Indicators: Discomfort, Grimacing, Guarding Pain Intervention(s): Limited activity within patient's tolerance     Extremity/Trunk Assessment Upper  Extremity Assessment Upper Extremity Assessment: Overall WFL for tasks assessed (limited use of RUE due to R flank pain)   Lower Extremity Assessment Lower Extremity Assessment: Defer to PT evaluation   Cervical / Trunk Assessment Cervical / Trunk Assessment: Normal   Communication Communication Communication: No apparent difficulties   Cognition Arousal: Alert Behavior During Therapy: WFL for tasks assessed/performed Cognition: No apparent impairments             OT - Cognition Comments: overall WFL, slightly impaired insight to safety                 Following commands: Intact       Cueing  General Comments   Cueing Techniques: Verbal cues  HR to 130s with mobility   Exercises Exercises: Other exercises Other Exercises Other Exercises: incentive spirometer use, pt demonstrated great ability 3x   Shoulder Instructions      Home Living Family/patient expects to be discharged to:: Private residence Living Arrangements: Alone Available Help at Discharge: Other (Comment) (very limited home support per pt) Type of Home: House Home Access: Stairs to enter [employer] of Steps: 2-3 Entrance Stairs-Rails: Right;Left Home Layout: One level     Bathroom Shower/Tub: Chief Strategy Officer: Standard     Home Equipment: None   Additional Comments: pt has 2 cats      Prior Functioning/Environment Prior Level of Function : Independent/Modified Independent             Mobility Comments: indep, no falls prior to the one leading to admission ADLs Comments: indep, does not worl    OT Problem List: Decreased strength;Decreased activity tolerance;Decreased range of motion;Impaired balance (sitting and/or standing);Decreased safety awareness;Decreased knowledge of use of DME or AE;Decreased knowledge of precautions   OT Treatment/Interventions: Self-care/ADL training;Therapeutic exercise;DME and/or AE instruction;Therapeutic  activities;Energy conservation;Balance training      OT Goals(Current goals can be found in the care plan section)   Acute Rehab OT Goals Patient Stated Goal: home OT Goal Formulation: With patient Time For Goal Achievement: 10/24/24 Potential to Achieve Goals: Good ADL Goals Pt Will Perform Grooming: Independently Pt Will Perform Upper Body Dressing: Independently Pt Will Perform Lower Body Dressing: Independently;sit to/from stand Pt Will Transfer to Toilet: Independently;ambulating   OT Frequency:  Min 2X/week    Co-evaluation              AM-PAC OT 6 Clicks Daily Activity     Outcome Measure Help from another person eating meals?: None Help from another person taking care of personal grooming?: A Little Help from another person toileting, which includes using toliet, bedpan, or urinal?: A Little Help from another person bathing (including washing, rinsing, drying)?: A Little Help from another person to put on and taking off regular upper body clothing?: A Little Help from another person to put on and taking off regular lower body clothing?: A Little 6 Click Score: 19   End of Session Equipment Utilized During Treatment: Rolling walker (2 wheels);Gait belt Nurse Communication: Mobility status  Activity Tolerance: Patient tolerated treatment well Patient left: in chair;with call bell/phone within reach  OT Visit Diagnosis: Unsteadiness on feet (  R26.81);Other abnormalities of gait and mobility (R26.89);Feeding difficulties (R63.3)                Time: 9170-9149 OT Time Calculation (min): 21 min Charges:  OT General Charges $OT Visit: 1 Visit OT Evaluation $OT Eval Moderate Complexity: 1 Mod  Lucie Kendall, OTR/L Acute Rehabilitation Services Office [phone] Secure Chat Communication Preferred   Lucie JONETTA Kendall 10/10/2024, 9:59 AM

## 2024-10-10 NOTE — Progress Notes (Signed)
 PT Cancellation Note  Patient Details Name: Zachary [name] MRN: [id number] DOB: [birth year]   Cancelled Treatment:    Reason Eval/Treat Not Completed: Patient at procedure or test/unavailable  Initial attempt, pt working with OT. Currently pt just received breakfast. Will return shortly for PT eval.    Macario RAMAN, PT Acute Rehabilitation Services  Office [phone]  Macario SHAUNNA Soja 10/10/2024, 9:45 AM

## 2024-10-10 NOTE — Evaluation (Signed)
 Physical Therapy Evaluation Patient Details Name: Zachary [name] MRN: [id number] DOB: [birth year] Today's Date: 10/10/2024  History of Present Illness  43 year old male presented to the ED via EMS after enduring a fall as he was exiting his shower. R 8-10 rib fxs, trace pneumothorax, PMH- UC, HTN, and latent TB of the lung  Clinical Impression  Pt admitted secondary to problem above with deficits below. PTA patient lives alone in one-level home with 2 steps to enter with rails. Pt currently requires CGA for transfers and ambulation without device (attempted use of RW with poor use and reported incr rib pain). Able to ascend/descend 10 steps with rt rail modified independent with HR 140s. Feel patient will do better without a RW. No followup PT indicated.  Anticipate patient will benefit from PT to address problems listed below. Will continue to follow acutely to maximize functional mobility, independence, and safety.           If plan is discharge home, recommend the following: Assist for transportation   Can travel by private vehicle        Equipment Recommendations None recommended by PT  Recommendations for Other Services       Functional Status Assessment Patient has had a recent decline in their functional status and demonstrates the ability to make significant improvements in function in a reasonable and predictable amount of time.     Precautions / Restrictions Precautions Precautions: Fall Restrictions Weight Bearing Restrictions Per Provider Order: No      Mobility  Bed Mobility               General bed mobility comments: pt did not recall any education from OT re: rolling and side to sit to minimize pain/strain; pt educated he may be most comfortable sleeping in his recliner    Transfers Overall transfer level: Needs assistance Equipment used: Rolling walker (2 wheels), None Transfers: Sit to/from Stand Sit to Stand: Contact guard assist            General transfer comment: vc for use of RW; repeated without DME; no imbalance noted (was unsteady earlier with OT)    Ambulation/Gait Ambulation/Gait assistance: Contact guard assist Gait Distance (Feet): 250 Feet Assistive device: Rolling walker (2 wheels), None Gait Pattern/deviations: Step-through pattern, Decreased stride length   Gait velocity interpretation: 1.31 - 2.62 ft/sec, indicative of limited community ambulator   General Gait Details: low foot clearance bil with decr UE swing; reports less pain without RW (had tendency to pick it up and carry, park it aside as approaching steps)  Stairs Stairs: Yes Stairs assistance: Modified independent (Device/Increase time) Stair Management: One rail Right, Alternating pattern, Step to pattern, Forwards Number of Stairs: 10 General stair comments: alternating to ascend; step-to descend  Wheelchair Mobility     Tilt Bed    Modified Rankin (Stroke Patients Only)       Balance Overall balance assessment: Needs assistance Sitting-balance support: Feet supported Sitting balance-Leahy Scale: Good     Standing balance support: No upper extremity supported, During functional activity Standing balance-Leahy Scale: Fair                               Pertinent Vitals/Pain Pain Assessment Pain Assessment: 0-10 Pain Score: 3  Pain Location: R flank Pain Descriptors / Indicators: Discomfort, Guarding Pain Intervention(s): Limited activity within patient's tolerance, Monitored during session    Home Living Family/patient expects to be discharged to::  Private residence Living Arrangements: Alone Available Help at Discharge: Other (Comment) (very limited home support per pt) Type of Home: House Home Access: Stairs to enter Entrance Stairs-Rails: Doctor, General Practice of Steps: 2-3   Home Layout: One level Home Equipment: None Additional Comments: pt has 2 cats    Prior Function Prior Level of  Function : Independent/Modified Independent             Mobility Comments: indep, no falls prior to the one leading to admission ADLs Comments: indep, does not worl     Extremity/Trunk Assessment   Upper Extremity Assessment Upper Extremity Assessment: Defer to OT evaluation    Lower Extremity Assessment Lower Extremity Assessment: Overall WFL for tasks assessed    Cervical / Trunk Assessment Cervical / Trunk Assessment: Normal  Communication   Communication Communication: No apparent difficulties    Cognition Arousal: Alert Behavior During Therapy: WFL for tasks assessed/performed                             Following commands: Intact       Cueing Cueing Techniques: Verbal cues     General Comments General comments (skin integrity, edema, etc.): HR 140 bpm after stairs/ambulation    Exercises     Assessment/Plan    PT Assessment Patient needs continued PT services  PT Problem List Decreased activity tolerance;Decreased balance;Decreased mobility;Cardiopulmonary status limiting activity;Obesity;Pain       PT Treatment Interventions Gait training;DME instruction;Stair training;Functional mobility training;Therapeutic activities;Balance training;Patient/family education    PT Goals (Current goals can be found in the Care Plan section)  Acute Rehab PT Goals Patient Stated Goal: go home soon PT Goal Formulation: With patient Time For Goal Achievement: 10/24/24 Potential to Achieve Goals: Good    Frequency Min 3X/week     Co-evaluation               AM-PAC PT 6 Clicks Mobility  Outcome Measure Help needed turning from your back to your side while in a flat bed without using bedrails?: A Little Help needed moving from lying on your back to sitting on the side of a flat bed without using bedrails?: A Little Help needed moving to and from a bed to a chair (including a wheelchair)?: A Little Help needed standing up from a chair using  your arms (e.g., wheelchair or bedside chair)?: A Little Help needed to walk in hospital room?: A Little Help needed climbing 3-5 steps with a railing? : None 6 Click Score: 19    End of Session Equipment Utilized During Treatment: Gait belt Activity Tolerance: Patient tolerated treatment well Patient left: in chair;with call bell/phone within reach;Other (comment) (trauma PA) Nurse Communication: Mobility status PT Visit Diagnosis: Other abnormalities of gait and mobility (R26.89);History of falling (Z91.81)    Time: 8983-8972 PT Time Calculation (min) (ACUTE ONLY): 11 min   Charges:   PT Evaluation $PT Eval Low Complexity: 1 Low   PT General Charges $$ ACUTE PT VISIT: 1 Visit          Macario RAMAN, PT Acute Rehabilitation Services  Office [phone]   Macario SHAUNNA Soja 10/10/2024, 10:41 AM

## 2024-10-11 ENCOUNTER — Ambulatory Visit: Payer: Self-pay

## 2024-10-11 ENCOUNTER — Telehealth: Payer: Self-pay

## 2024-10-11 NOTE — Telephone Encounter (Signed)
 Appt scheduled

## 2024-10-11 NOTE — Transitions of Care (Post Inpatient/ED Visit) (Unsigned)
   10/11/2024  Name: Zachary [name] MRN: [id number] DOB: Jul 21, 1981  Today's TOC FU Call Status: Today's TOC FU Call Status:: Unsuccessful Call (1st Attempt) Unsuccessful Call (1st Attempt) Date: 10/11/24  Attempted to reach the patient regarding the most recent Inpatient/ED visit.  Follow Up Plan: Additional outreach attempts will be made to reach the patient to complete the Transitions of Care (Post Inpatient/ED visit) call.   Signature Julian Lemmings, LPN [facility] Nurse Health Advisor Direct Dial [phone]

## 2024-10-11 NOTE — Telephone Encounter (Signed)
 FYI Only or Action Required?: FYI only for provider: appointment scheduled on 10/17/2024 at 11:20 AM.  Patient was last seen in primary care on 05/10/2024 by Zachary Waddell NOVAK, NP.  Called Nurse Triage reporting Rib Injury.  Symptoms began several days ago.  Interventions attempted: Prescription medications: oxycodone, Robaxin, Tylenol and Rest, hydration, or home remedies.  Symptoms are: unchanged.  Triage Disposition: See Within 2 Weeks in Office (overriding See PCP When Office is Open (Within 3 Days))  Patient/caregiver understands and will follow disposition?: Yes  Copied from CRM [id number]. Topic: Clinical - Red Word Triage >> Oct 11, 2024 12:26 PM Zachary [name] wrote: Zachary [name] that prompted transfer to Nurse Triage: Patient needs to schedule a hospital follow up . He is currently in pain from broken ribs. Reason for Disposition  [1] After 3 days AND [2] chest pain not improved  Answer Assessment - Initial Assessment Questions Patient called wanting to reschedule his appointment with PCP. Patient endorses having broken multiple ribs on Sunday. Patient was hospitalized for rib fractures. Patient endorses pain level of 7 out of 10. Patient scheduled for hospital follow up for Monday 10/17/2024 at 11:20 AM. Discussed with patient about his medications and uses. Encouraged patient to utilize his pain medications accordingly. Discussed use of incentive spirometer. Patient verbalized understanding and all questions answered.   1. MECHANISM: How did the injury happen?     Zachary [name] leaving the shower 2. ONSET: When did the injury happen? (.e.g., minutes, hours, days ago)     Happened on Sunday-was seen in the ED and admitted.  3. LOCATION: Where on the chest is the injury located?     Right side of chest-multiple rib fractures 4. APPEARANCE: What does the injury look like?     bruising 5. BLEEDING: Is there any bleeding now? If Yes, ask: How long has it been bleeding?     no 6. SEVERITY:  Any difficulty with breathing?     At times 7. SIZE: For cuts, bruises, or swelling, ask: How large is it? (e.g., inches or centimeters)     Some bruising 8. PAIN: Is there pain? If Yes, ask: How bad is the pain? (e.g., Scale 0-10; none, mild, moderate, severe)     7 out of 10  Protocols used: Chest Injury-A-AH

## 2024-10-12 NOTE — Transitions of Care (Post Inpatient/ED Visit) (Signed)
 10/12/2024  Name: Zachary [name] MRN: [id number] DOB: 04-30-1981  Today's TOC FU Call Status: Today's TOC FU Call Status:: Successful TOC FU Call Completed Unsuccessful Call (1st Attempt) Date: 10/11/24 [facility] FU Call Complete Date: 10/12/24  Patient's Name and Date of Birth confirmed. Name, DOB  Transition Care Management Follow-up Telephone Call Date of Discharge: 10/10/24 Discharge Facility: [facility]) Type of Discharge: Inpatient Admission Primary Inpatient Discharge Diagnosis:: fall, rib fx How have you been since you were released from the hospital?: Better Any questions or concerns?: No  Items Reviewed: Did you receive and understand the discharge instructions provided?: Yes Medications obtained,verified, and reconciled?: Yes (Medications Reviewed) Any new allergies since your discharge?: No Dietary orders reviewed?: Yes Do you have support at home?: No  Medications Reviewed Today: Medications Reviewed Today     Reviewed by Emmitt Pan, LPN (Licensed Practical Nurse) on 10/12/24 at 1350  Med List Status: <None>   Medication Order Taking? Sig Documenting Provider Last Dose Status Informant  acetaminophen (TYLENOL) 500 MG tablet [id number] Yes Take 1 tablet (500 mg total) by mouth every 6 (six) hours. Simaan, Elizabeth S, PA-C  Active   albuterol (VENTOLIN HFA) 108 (90 Base) MCG/ACT inhaler [id number] Yes Inhale 2 puffs into the lungs every 6 (six) hours as needed for wheezing or shortness of breath. Almarie Waddell NOVAK, NP  Active Self, Pharmacy Records  amLODipine  (NORVASC ) 10 MG tablet [id number] Yes Take 1 tablet (10 mg total) by mouth daily. Almarie Waddell NOVAK, NP  Active Self, Pharmacy Records  docusate sodium (COLACE) 100 MG capsule [id number] Yes Take 1 capsule (100 mg total) by mouth 2 (two) times daily. Simaan, Elizabeth S, PA-C  Active   guaifenesin (HUMIBID E) 400 MG TABS tablet [id number]  Take 1 tablet 3 times daily as needed for chest congestion and cough   Patient not taking: Reported on 10/12/2024   Joesph Shaver Scales, PA-C  Active Self, Pharmacy Records  methocarbamol (ROBAXIN) 500 MG tablet [id number] Yes Take 1.5 tablets (750 mg total) by mouth 3 (three) times daily. Simaan, Elizabeth S, PA-C  Active   Multiple Vitamin (MULTI-VITAMIN) tablet [id number] Yes Take 1 tablet by mouth daily. [provider]  Active Self, Pharmacy Records  oxyCODONE (OXY IR/ROXICODONE) 5 MG immediate release tablet [id number] Yes Take 1 tablet (5 mg total) by mouth every 4 (four) hours as needed for moderate pain (pain score 4-6). Simaan, Elizabeth S, PA-C  Active   polyethylene glycol powder (GLYCOLAX/MIRALAX) 17 GM/SCOOP powder [id number] Yes Take 17 g by mouth daily as needed (constipation). Dissolve 1 capful (17g) in 4-8 ounces of liquid and take by mouth daily. Augustus Almarie RAMAN, PA-C  Active   promethazine-dextromethorphan (PROMETHAZINE-DM) 6.25-15 MG/5ML syrup [id number]  Take 5 mLs by mouth at bedtime as needed for cough.  Patient not taking: Reported on 10/12/2024   Joesph Shaver Scales, PA-C  Active Self, Pharmacy Records  pyridOXINE  (B-6) 50 MG tablet [id number] Yes Take 1 tablet (50 mg total) by mouth daily. Efrain Lamar ORN, MD  Active Self, Pharmacy Records  sertraline  (ZOLOFT ) 100 MG tablet [id number] Yes Take 2 tablets (200 mg total) by mouth daily. Almarie Waddell NOVAK, NP  Active Self, Pharmacy Records  Upadacitinib  ER (RINVOQ ) 30 MG TB24 [id number] Yes Take 1 tablet (30 mg total) by mouth daily. May, Deanna J, NP  Active Self, Pharmacy Records            Home Care and Equipment/Supplies: Were Home Health Services Ordered?: NA Any new equipment or medical  supplies ordered?: NA  Functional Questionnaire: Do you need assistance with bathing/showering or dressing?: No Do you need assistance with meal preparation?: No Do you need assistance with eating?: No Do you have difficulty maintaining continence: No Do you have difficulty managing or  taking your medications?: No  Follow up appointments reviewed: PCP Follow-up appointment confirmed?: Yes Date of PCP follow-up appointment?: 10/14/24 Follow-up Provider: [facility] Follow-up appointment confirmed?: NA Do you need transportation to your follow-up appointment?: No Do you understand care options if your condition(s) worsen?: Yes-patient verbalized understanding    SIGNATURE Julian Lemmings, LPN [facility] Nurse Health Advisor Direct Dial [phone]

## 2024-10-13 ENCOUNTER — Encounter (HOSPITAL_COMMUNITY): Payer: Self-pay

## 2024-10-14 ENCOUNTER — Ambulatory Visit: Admitting: Family Medicine

## 2024-10-16 NOTE — Progress Notes (Incomplete)
 Established Patient Office Visit  Subjective:  Patient ID: Zachary [name], male    DOB: 01/02/1981  Age: 43 y.o. MRN: [id number]  CC: No chief complaint on file.     HPI Zachary [name] is here for Hospital Follow-up. He was seen in the emergency department on 10/09/2024 following a fall. He hit his ribs against the edge of his tub, but wasn't sure if he'd hit his head or lost consciousness. Labs abnormal for Potassium 3.2, Chloride 97, glucose 162, Cr 1.28, Calcium 8.1, Albumin 3.3, AST 116, ALT 71, Anion gap 18, RBC 3.48, HGB 11.0, HCT 32.8, Ethanol 257, and Protime-INR 17.1, 1.3. Imaging included:  Ribs Unilateral W/ chest right completed showing trace right basilar pneumothorax. Minimally displaced right eighth and ninth rib fractures. Right lateral chest wall subcutaneous emphysema.        Past Medical History:  Diagnosis Date   Allergy    Anxiety    Depression    Hypertension    Ulcerative colitis (HCC)    Ulcerative colitis (HCC) 02/12/2022    Past Surgical History:  Procedure Laterality Date   COLONOSCOPY      Family History  Problem Relation Age of Onset   COPD Mother    Healthy Father    Ovarian cancer Paternal Aunt    Colon cancer Cousin    Colon polyps Neg Hx    Esophageal cancer Neg Hx    Rectal cancer Neg Hx    Stomach cancer Neg Hx     Social History   Socioeconomic History   Marital status: Single    Spouse name: Not on file   Number of children: Not on file   Years of education: Not on file   Highest education level: Master's degree (e.g., MA, MS, MEng, MEd, MSW, MBA)  Occupational History   Not on file  Tobacco Use   Smoking status: Never    Passive exposure: Past   Smokeless tobacco: Former    Types: Snuff   Tobacco comments:    States trying to stop  Vaping Use   Vaping status: Never Used  Substance and Sexual Activity   Alcohol use: Yes    Comment: occasionally   Drug use: Never   Sexual activity: Not on file  Other  Topics Concern   Not on file  Social History Narrative   Not on file   Social Drivers of Health   Financial Resource Strain: Low Risk  (05/03/2024)   Overall Financial Resource Strain (CARDIA)    Difficulty of Paying Living Expenses: Not very hard  Food Insecurity: No Food Insecurity (10/09/2024)   Hunger Vital Sign    Worried About Running Out of Food in the Last Year: Never true    Ran Out of Food in the Last Year: Never true  Transportation Needs: No Transportation Needs (10/09/2024)   PRAPARE - Administrator, Civil Service (Medical): No    Lack of Transportation (Non-Medical): No  Physical Activity: Unknown (05/03/2024)   Exercise Vital Sign    Days of Exercise per Week: 0 days    Minutes of Exercise per Session: Not on file  Stress: No Stress Concern Present (05/03/2024)   [employer] of Occupational Health - Occupational Stress Questionnaire    Feeling of Stress : Only a little  Social Connections: Socially Isolated (05/03/2024)   Social Connection and Isolation Panel    Frequency of Communication with Friends and Family: Once a week    Frequency of Social Gatherings with  Friends and Family: Never    Attends Religious Services: Never    Database Administrator or Organizations: No    Attends Engineer, Structural: Not on file    Marital Status: Never married  Intimate Partner Violence: Not At Risk (10/09/2024)   Humiliation, Afraid, Rape, and Kick questionnaire    Fear of Current or Ex-Partner: No    Emotionally Abused: No    Physically Abused: No    Sexually Abused: No    ROS All ROS negative except what is listed in the HPI.   Objective:   Today's Vitals: There were no vitals taken for this visit.  Physical Exam  Assessment & Plan:   Problem List Items Addressed This Visit   None     Follow-up: No follow-ups on file.   Zachary FURY Almarie, DNP, FNP-C  I,Emily Lagle,acting as a neurosurgeon for Zachary KATHEE Almarie, NP.,have documented all relevant  documentation on the behalf of Zachary KATHEE Almarie, NP.  I, Zachary KATHEE Almarie, NP, have reviewed all documentation for this visit. The documentation on 10/17/2024 for the exam, diagnosis, procedures, and orders are all accurate and complete.

## 2024-10-17 ENCOUNTER — Inpatient Hospital Stay: Admitting: Family Medicine

## 2024-10-17 ENCOUNTER — Ambulatory Visit: Payer: Self-pay

## 2024-10-17 NOTE — Telephone Encounter (Signed)
 2nd attempt to reach pt to reschedule. Left VM to call back

## 2024-10-17 NOTE — Telephone Encounter (Signed)
 Pt no longer on line when PAS attempted to transfer to NT.  Will place in call back folder.

## 2024-10-17 NOTE — Telephone Encounter (Signed)
 Copied from CRM #[id number]. Topic: Clinical - Red Word Triage >> Oct 17, 2024 11:15 AM [name] wrote: [name] that prompted transfer to Nurse Triage: Patient needs to reschedule his hospital follow-up due to broken ribs.   He is scheduled for 11:20, but I was able to get the okay from CAL to reschedule.

## 2024-10-17 NOTE — Telephone Encounter (Signed)
 Hospital Followup rescheduled---patient had car trouble this morning and could not make his appointment   FYI Only or Action Required?: FYI only for provider: hospital follow up rescheduled for 10/19/2024 at 11:20am.  Patient was last seen in primary care on 05/10/2024 by Almarie Waddell NOVAK, NP.  Called Nurse Triage reporting Rib Injury.   Symptoms are: gradually improving.  Triage Disposition: See PCP When Office is Open (Within 3 Days)  Patient/caregiver understands and will follow disposition?: Yes       Reason for Disposition  [1] After 3 days AND [2] chest pain not improved  Answer Assessment - Initial Assessment Questions Patient had car trouble this morning and wasn't able to come in for his appointment this morning  ED 10/09/2024 ---today is a follow up Patient needed to reschedule a hospital followup He states he feels a pinching in the right side of his neck  Soreness has extended around to the front and back of right side of chest Patient states nothing is worse than when he was in the hospital He states his pain and breathing are a lot better Patient states he has been taking some ibuprofen  Called CAL about rescheduling this patient due to a previous note saying okay to schedule to another triage RN.  The earliest date this RN saw available with patient's PCP was Thursday but when CAL was contacted by this RN the earliest was Wednesday with his PCP---this RN verified with the patient that he could come in 10/19/2024 at 11:20am with his PCP and he stated that he could. CAL scheduled patient and this RN advised him to call us  back if anything changes or with any further questions/concerns. Patient is advised that if anything worsens to go to the Emergency Room. Patient verbalized understanding.       1. MECHANISM: How did the injury happen?     Fell in shower 2. ONSET: When did the injury happen? (.e.g., minutes, hours, days ago)     10/09/2024  seen in  shower and admitted 3. LOCATION: Where on the chest is the injury located?     Right side 4. APPEARANCE: What does the injury look like?     same 5. BLEEDING: Is there any bleeding now? If Yes, ask: How long has it been bleeding?     no 6. SEVERITY: Any difficulty with breathing?     no 7. SIZE: For cuts, bruises, or swelling, ask: How large is it? (e.g., inches or centimeters)     bruising 8. PAIN: Is there pain? If Yes, ask: How bad is the pain? (e.g., Scale 0-10; none, mild, moderate, severe)     3 ----  4 or 5 with certain movements  Protocols used: Chest Injury-A-AH

## 2024-10-17 NOTE — Telephone Encounter (Signed)
 Appt rescheduled

## 2024-10-19 ENCOUNTER — Encounter: Payer: Self-pay | Admitting: Family Medicine

## 2024-10-19 ENCOUNTER — Other Ambulatory Visit (HOSPITAL_BASED_OUTPATIENT_CLINIC_OR_DEPARTMENT_OTHER): Payer: Self-pay

## 2024-10-19 ENCOUNTER — Other Ambulatory Visit: Payer: Self-pay

## 2024-10-19 ENCOUNTER — Ambulatory Visit: Admitting: Family Medicine

## 2024-10-19 DIAGNOSIS — S2241XA Multiple fractures of ribs, right side, initial encounter for closed fracture: Secondary | ICD-10-CM | POA: Diagnosis not present

## 2024-10-19 DIAGNOSIS — I10 Essential (primary) hypertension: Secondary | ICD-10-CM | POA: Diagnosis not present

## 2024-10-19 LAB — COMPREHENSIVE METABOLIC PANEL WITH GFR
ALT: 46 U/L (ref 0–53)
AST: 75 U/L — ABNORMAL HIGH (ref 0–37)
Albumin: 4 g/dL (ref 3.5–5.2)
Alkaline Phosphatase: 157 U/L — ABNORMAL HIGH (ref 39–117)
BUN: 14 mg/dL (ref 6–23)
CO2: 29 meq/L (ref 19–32)
Calcium: 9.3 mg/dL (ref 8.4–10.5)
Chloride: 99 meq/L (ref 96–112)
Creatinine, Ser: 0.9 mg/dL (ref 0.40–1.50)
GFR: 103.47 mL/min (ref >60.00)
Glucose, Bld: 100 mg/dL — ABNORMAL HIGH (ref 70–99)
Potassium: 3.9 meq/L (ref 3.5–5.1)
Sodium: 137 meq/L (ref 135–145)
Total Bilirubin: 0.5 mg/dL (ref 0.2–1.2)
Total Protein: 7.1 g/dL (ref 6.0–8.3)

## 2024-10-19 LAB — VITAMIN D 25 HYDROXY (VIT D DEFICIENCY, FRACTURES): VITD: 18.26 ng/mL — ABNORMAL LOW (ref 30.00–100.00)

## 2024-10-19 MED ORDER — AMLODIPINE BESYLATE 10 MG PO TABS
10.0000 mg | ORAL_TABLET | Freq: Every day | ORAL | 1 refills | Status: AC
  Filled 2024-10-19 (×2): qty 90, 90d supply, fill #0
  Filled 2024-11-29: qty 90, 90d supply, fill #1

## 2024-10-19 MED ORDER — TIZANIDINE HCL 4 MG PO TABS
4.0000 mg | ORAL_TABLET | Freq: Four times a day (QID) | ORAL | 0 refills | Status: DC | PRN
  Filled 2024-10-19 (×2): qty 30, 8d supply, fill #0

## 2024-10-19 MED ORDER — MELOXICAM 7.5 MG PO TABS
7.5000 mg | ORAL_TABLET | Freq: Every day | ORAL | 0 refills | Status: AC
  Filled 2024-10-19 (×2): qty 30, 30d supply, fill #0

## 2024-10-19 NOTE — Assessment & Plan Note (Signed)
 Still sore, but making some progress. No breathing difficulties.  - Prescribed meloxicam  for pain and inflammation. - Advised against ibuprofen or Aleve while on meloxicam . Monitor for signs of GI bleed. - Prescribed muscle relaxer for spasms. - Recommended alternating heat and ice for pain relief. - Advised against heavy lifting and strenuous activities until healed.

## 2024-10-19 NOTE — Assessment & Plan Note (Signed)
 Blood pressure is at goal for age and co-morbidities.   Recommendations: continue amlodipine  10 mg daily - BP goal <130/80 - monitor and log blood pressures at home - check around the same time each day in a relaxed setting - Limit salt to <2000 mg/day - Follow DASH eating plan (heart healthy diet) - limit alcohol to 2 standard drinks per day for men and 1 per day for women - avoid tobacco products - get at least 2 hours of regular aerobic exercise weekly Patient aware of signs/symptoms requiring further/urgent evaluation.

## 2024-10-19 NOTE — Progress Notes (Signed)
 Established Patient Office Visit  Subjective:  Patient ID: Zachary [name], male    DOB: [birth year]  Age: 43 y.o. MRN: [id number]  CC:  Chief Complaint  Patient presents with   Hospitalization Follow-up      HPI Zachary [name] is here for hospital follow-up. He was seen in the emergency department on 10/09/2024 following a fall exiting his shower. He hit his right side against the edge of his tub, but wasn't sure if he'd lost consciousness or injured his head. Imaging notable on CT Chest/Abdomen/Pelvis for multiple acute right rib fractures including the lateral aspects of the 8th and 9th ribs and a comminuted fracture of the posterior right 10th rib. Small right anterior and lateral pneumothorax extending over the anterior right lung base. Soft tissue gas along the right lateral and posterior chest wall extending into the right lower neck and toward the midline of the posterior abdominal wall. Decompressed ascending and proximal transverse colon with apparent mucosal enhancement and wall thickening. This is consistent with the clinical history of ulcerative colitis. Pneumothorax resolved before discharge and he was breathing fine on room air. He was evaluated by PT/OT with no follow-up recommended.   Since being home he is making progress. Still sore, but improving. He is noticing some muscle spasms in his lateral core and lower back muscles. States he did not use any of the oxycodone due to family history of addiction. Reports he has quit drinking since the incident. States his mood has been improving and he is content with current Zoloft  dose. Not interested in counseling at this time. Denies SI/HI.       10/19/2024   11:10 AM 05/10/2024   12:17 PM 04/18/2022    2:28 PM  PHQ9 SCORE ONLY  PHQ-9 Total Score 14 12  0      Data saved with a previous flowsheet row definition      10/19/2024   11:11 AM 05/10/2024   12:17 PM 03/20/2022    4:05 PM  GAD 7 : Generalized Anxiety Score   Nervous, Anxious, on Edge 1 1 2   Control/stop worrying 1 0 0  Worry too much - different things 1 0 0  Trouble relaxing 3 1 1   Restless 1 0 0  Easily annoyed or irritable 0 0 2  Afraid - awful might happen 1 1 0  Total GAD 7 Score 8 3 5   Anxiety Difficulty Somewhat difficult Not difficult at all Somewhat difficult         Past Medical History:  Diagnosis Date   Allergy    Anxiety    Depression    Hypertension    Ulcerative colitis (HCC)    Ulcerative colitis (HCC) 02/12/2022    Past Surgical History:  Procedure Laterality Date   COLONOSCOPY      Family History  Problem Relation Age of Onset   COPD Mother    Healthy Father    Ovarian cancer Paternal Aunt    Colon cancer Cousin    Colon polyps Neg Hx    Esophageal cancer Neg Hx    Rectal cancer Neg Hx    Stomach cancer Neg Hx     Social History   Socioeconomic History   Marital status: Single    Spouse name: Not on file   Number of children: Not on file   Years of education: Not on file   Highest education level: Master's degree (e.g., MA, MS, MEng, MEd, MSW, MBA)  Occupational History   Not on file  Tobacco Use   Smoking status: Never    Passive exposure: Past   Smokeless tobacco: Former    Types: Snuff   Tobacco comments:    States trying to stop  Vaping Use   Vaping status: Never Used  Substance and Sexual Activity   Alcohol use: Yes    Comment: occasionally   Drug use: Never   Sexual activity: Not on file  Other Topics Concern   Not on file  Social History Narrative   Not on file   Social Drivers of Health   Financial Resource Strain: Low Risk  (05/03/2024)   Overall Financial Resource Strain (CARDIA)    Difficulty of Paying Living Expenses: Not very hard  Food Insecurity: No Food Insecurity (10/09/2024)   Hunger Vital Sign    Worried About Running Out of Food in the Last Year: Never true    Ran Out of Food in the Last Year: Never true  Transportation Needs: No Transportation Needs  (10/09/2024)   PRAPARE - Administrator, Civil Service (Medical): No    Lack of Transportation (Non-Medical): No  Physical Activity: Unknown (05/03/2024)   Exercise Vital Sign    Days of Exercise per Week: 0 days    Minutes of Exercise per Session: Not on file  Stress: No Stress Concern Present (05/03/2024)   [employer] of Occupational Health - Occupational Stress Questionnaire    Feeling of Stress : Only a little  Social Connections: Socially Isolated (05/03/2024)   Social Connection and Isolation Panel    Frequency of Communication with Friends and Family: Once a week    Frequency of Social Gatherings with Friends and Family: Never    Attends Religious Services: Never    Database Administrator or Organizations: No    Attends Engineer, Structural: Not on file    Marital Status: Never married  Intimate Partner Violence: Not At Risk (10/09/2024)   Humiliation, Afraid, Rape, and Kick questionnaire    Fear of Current or Ex-Partner: No    Emotionally Abused: No    Physically Abused: No    Sexually Abused: No    ROS All ROS negative except what is listed in the HPI.   Objective:   Today's Vitals: BP 108/80   Pulse 80   Ht 6' (1.829 m)   Wt 240 lb (108.9 kg)   SpO2 96%   BMI 32.55 kg/m   Physical Exam Vitals reviewed.  Constitutional:      Appearance: Normal appearance.  Cardiovascular:     Rate and Rhythm: Normal rate and regular rhythm.     Heart sounds: Normal heart sounds.  Pulmonary:     Effort: Pulmonary effort is normal.     Breath sounds: Normal breath sounds. No wheezing, rhonchi or rales.     Comments: Right lateral ribs tender Skin:    General: Skin is warm and dry.  Neurological:     Mental Status: He is alert and oriented to person, place, and time.  Psychiatric:        Mood and Affect: Mood normal.        Behavior: Behavior normal.        Thought Content: Thought content normal.        Judgment: Judgment normal.          Assessment & Plan:   Problem List Items Addressed This Visit       Active Problems   Primary hypertension   Blood pressure is at goal  for age and co-morbidities.   Recommendations: continue amlodipine  10 mg daily - BP goal <130/80 - monitor and log blood pressures at home - check around the same time each day in a relaxed setting - Limit salt to <2000 mg/day - Follow DASH eating plan (heart healthy diet) - limit alcohol to 2 standard drinks per day for men and 1 per day for women - avoid tobacco products - get at least 2 hours of regular aerobic exercise weekly Patient aware of signs/symptoms requiring further/urgent evaluation.       Relevant Medications   amLODipine  (NORVASC ) 10 MG tablet   Other Relevant Orders   Comprehensive metabolic panel with GFR   Rib fracture, hospital follow-up   Still sore, but making some progress. No breathing difficulties.  - Prescribed meloxicam  for pain and inflammation. - Advised against ibuprofen or Aleve while on meloxicam . Monitor for signs of GI bleed. - Prescribed muscle relaxer for spasms. - Recommended alternating heat and ice for pain relief. - Advised against heavy lifting and strenuous activities until healed.      Relevant Medications   tiZANidine  (ZANAFLEX ) 4 MG tablet   meloxicam  (MOBIC ) 7.5 MG tablet   Other Visit Diagnoses       Hypocalcemia    Repeat labs today   Relevant Orders   Comprehensive metabolic panel with GFR   VITAMIN D  25 Hydroxy (Vit-D Deficiency, Fractures)         Follow-up: Return for physical (due after June).   Waddell FURY Almarie, DNP, FNP-C  I,Emily Lagle,acting as a neurosurgeon for Waddell KATHEE Almarie, NP.,have documented all relevant documentation on the behalf of Waddell KATHEE Almarie, NP.  I, Waddell KATHEE Almarie, NP, have reviewed all documentation for this visit. The documentation on 10/19/2024 for the exam, diagnosis, procedures, and orders are all accurate and complete.

## 2024-10-20 ENCOUNTER — Ambulatory Visit: Payer: Self-pay | Admitting: Family Medicine

## 2024-10-20 DIAGNOSIS — R7401 Elevation of levels of liver transaminase levels: Secondary | ICD-10-CM

## 2024-10-20 DIAGNOSIS — R748 Abnormal levels of other serum enzymes: Secondary | ICD-10-CM

## 2024-10-25 ENCOUNTER — Other Ambulatory Visit: Payer: Self-pay

## 2024-10-28 ENCOUNTER — Other Ambulatory Visit: Payer: Self-pay | Admitting: Gastroenterology

## 2024-10-28 ENCOUNTER — Emergency Department (HOSPITAL_COMMUNITY)

## 2024-10-28 ENCOUNTER — Other Ambulatory Visit: Payer: Self-pay

## 2024-10-28 ENCOUNTER — Observation Stay (HOSPITAL_COMMUNITY)

## 2024-10-28 ENCOUNTER — Inpatient Hospital Stay (HOSPITAL_COMMUNITY)
Admission: EM | Admit: 2024-10-28 | Discharge: 2024-11-02 | DRG: 200 | Disposition: A | Attending: General Surgery | Admitting: General Surgery

## 2024-10-28 ENCOUNTER — Encounter (HOSPITAL_COMMUNITY): Payer: Self-pay | Admitting: *Deleted

## 2024-10-28 ENCOUNTER — Other Ambulatory Visit (HOSPITAL_COMMUNITY): Payer: Self-pay

## 2024-10-28 DIAGNOSIS — J90 Pleural effusion, not elsewhere classified: Secondary | ICD-10-CM | POA: Diagnosis not present

## 2024-10-28 DIAGNOSIS — J942 Hemothorax: Principal | ICD-10-CM | POA: Diagnosis present

## 2024-10-28 DIAGNOSIS — R918 Other nonspecific abnormal finding of lung field: Secondary | ICD-10-CM | POA: Diagnosis not present

## 2024-10-28 DIAGNOSIS — R0602 Shortness of breath: Secondary | ICD-10-CM | POA: Diagnosis not present

## 2024-10-28 DIAGNOSIS — S271XXA Traumatic hemothorax, initial encounter: Secondary | ICD-10-CM | POA: Diagnosis not present

## 2024-10-28 DIAGNOSIS — S2231XD Fracture of one rib, right side, subsequent encounter for fracture with routine healing: Secondary | ICD-10-CM | POA: Diagnosis not present

## 2024-10-28 DIAGNOSIS — K51919 Ulcerative colitis, unspecified with unspecified complications: Secondary | ICD-10-CM

## 2024-10-28 DIAGNOSIS — J9811 Atelectasis: Secondary | ICD-10-CM | POA: Diagnosis not present

## 2024-10-28 DIAGNOSIS — S2241XA Multiple fractures of ribs, right side, initial encounter for closed fracture: Secondary | ICD-10-CM

## 2024-10-28 DIAGNOSIS — I2510 Atherosclerotic heart disease of native coronary artery without angina pectoris: Secondary | ICD-10-CM | POA: Diagnosis not present

## 2024-10-28 LAB — CBC
HCT: 39.8 % (ref 39.0–52.0)
HCT: 40.4 % (ref 39.0–52.0)
Hemoglobin: 13.5 g/dL (ref 13.0–17.0)
Hemoglobin: 13.6 g/dL (ref 13.0–17.0)
MCH: 32 pg (ref 26.0–34.0)
MCH: 32.4 pg (ref 26.0–34.0)
MCHC: 33.7 g/dL (ref 30.0–36.0)
MCHC: 33.9 g/dL (ref 30.0–36.0)
MCV: 95.1 fL (ref 80.0–100.0)
MCV: 95.4 fL (ref 80.0–100.0)
Platelets: 520 K/uL — ABNORMAL HIGH (ref 150–400)
Platelets: 570 K/uL — ABNORMAL HIGH (ref 150–400)
RBC: 4.17 MIL/uL — ABNORMAL LOW (ref 4.22–5.81)
RBC: 4.25 MIL/uL (ref 4.22–5.81)
RDW: 15.4 % (ref 11.5–15.5)
RDW: 15.6 % — ABNORMAL HIGH (ref 11.5–15.5)
WBC: 6.5 K/uL (ref 4.0–10.5)
WBC: 7 K/uL (ref 4.0–10.5)
nRBC: 0 % (ref 0.0–0.2)
nRBC: 0 % (ref 0.0–0.2)

## 2024-10-28 LAB — COMPREHENSIVE METABOLIC PANEL WITH GFR
ALT: 44 U/L (ref 0–44)
AST: 63 U/L — ABNORMAL HIGH (ref 15–41)
Albumin: 3.9 g/dL (ref 3.5–5.0)
Alkaline Phosphatase: 114 U/L (ref 38–126)
Anion gap: 18 — ABNORMAL HIGH (ref 5–15)
BUN: <5 mg/dL — ABNORMAL LOW (ref 6–20)
CO2: 21 mmol/L — ABNORMAL LOW (ref 22–32)
Calcium: 8.7 mg/dL — ABNORMAL LOW (ref 8.9–10.3)
Chloride: 101 mmol/L (ref 98–111)
Creatinine, Ser: 0.98 mg/dL (ref 0.61–1.24)
GFR, Estimated: >60 mL/min (ref >60)
Glucose, Bld: 147 mg/dL — ABNORMAL HIGH (ref 70–99)
Potassium: 3.1 mmol/L — ABNORMAL LOW (ref 3.5–5.1)
Sodium: 140 mmol/L (ref 135–145)
Total Bilirubin: 0.9 mg/dL (ref 0.0–1.2)
Total Protein: 8 g/dL (ref 6.5–8.1)

## 2024-10-28 MED ORDER — IOHEXOL 350 MG/ML SOLN
75.0000 mL | Freq: Once | INTRAVENOUS | Status: AC | PRN
  Administered 2024-10-28: 75 mL via INTRAVENOUS

## 2024-10-28 MED ORDER — MIDAZOLAM HCL (PF) 2 MG/2ML IJ SOLN
1.0000 mg | Freq: Once | INTRAMUSCULAR | Status: AC
  Administered 2024-10-28: 1 mg via INTRAVENOUS
  Filled 2024-10-28: qty 2

## 2024-10-28 MED ORDER — ENOXAPARIN SODIUM 40 MG/0.4ML IJ SOSY
40.0000 mg | PREFILLED_SYRINGE | INTRAMUSCULAR | Status: DC
  Administered 2024-10-29 – 2024-11-01 (×4): 40 mg via SUBCUTANEOUS
  Filled 2024-10-28 (×4): qty 0.4

## 2024-10-28 MED ORDER — AMLODIPINE BESYLATE 10 MG PO TABS
10.0000 mg | ORAL_TABLET | Freq: Every day | ORAL | Status: DC
  Administered 2024-10-28 – 2024-11-02 (×6): 10 mg via ORAL
  Filled 2024-10-28 (×6): qty 1

## 2024-10-28 MED ORDER — LIDOCAINE HCL (PF) 1 % IJ SOLN: 30.0000 mL | Freq: Once | INTRAMUSCULAR | Status: DC

## 2024-10-28 MED ORDER — LIDOCAINE HCL 1 % IJ SOLN
50.0000 mL | Freq: Once | INTRAMUSCULAR | Status: DC
  Filled 2024-10-28: qty 50

## 2024-10-28 MED ORDER — OXYCODONE HCL 5 MG PO TABS
5.0000 mg | ORAL_TABLET | ORAL | Status: DC | PRN
  Administered 2024-10-28: 5 mg via ORAL
  Administered 2024-10-29 – 2024-10-30 (×4): 10 mg via ORAL
  Administered 2024-10-31: 5 mg via ORAL
  Filled 2024-10-28 (×3): qty 2
  Filled 2024-10-28: qty 1
  Filled 2024-10-28: qty 2
  Filled 2024-10-28: qty 1

## 2024-10-28 MED ORDER — LIDOCAINE HCL (PF) 1 % IJ SOLN
INTRAMUSCULAR | Status: AC
  Filled 2024-10-28: qty 30

## 2024-10-28 MED ORDER — MORPHINE SULFATE (PF) 4 MG/ML IV SOLN
8.0000 mg | Freq: Once | INTRAVENOUS | Status: AC
  Administered 2024-10-28: 8 mg via INTRAVENOUS
  Filled 2024-10-28: qty 2

## 2024-10-28 MED ORDER — HYDROMORPHONE HCL 1 MG/ML IJ SOLN
2.0000 mg | Freq: Once | INTRAMUSCULAR | Status: AC
  Administered 2024-10-28: 1 mg via INTRAVENOUS
  Filled 2024-10-28: qty 2

## 2024-10-28 MED ORDER — ACETAMINOPHEN 500 MG PO TABS
1000.0000 mg | ORAL_TABLET | Freq: Four times a day (QID) | ORAL | Status: DC
  Administered 2024-10-28 – 2024-11-02 (×16): 1000 mg via ORAL
  Filled 2024-10-28 (×17): qty 2

## 2024-10-28 NOTE — ED Provider Notes (Signed)
 [place] EMERGENCY DEPARTMENT AT [facility] Provider Note   CSN: [id number] Arrival date & time: 10/28/24  1656     Patient presents with: No chief complaint on file.   Zachary [name] is a 43 y.o. male.   43 year old male presents with sudden onset of right-sided posterior rib pain that began yesterday.  Patient diagnosed with multiple rib fractures back on the ninth of this month.  On the right side 8th and 9th ribs.  As well as the 10th rib.  States that he has had increased dyspnea exertion.  Denies any hemoptysis.  No history of PE.  No leg pain or swelling.  Describes this pain as sharp and pleuritic.  No recent fever or chills       Prior to Admission medications   Medication Sig Start Date End Date Taking? Authorizing Provider  acetaminophen  (TYLENOL ) 500 MG tablet Take 1 tablet (500 mg total) by mouth every 6 (six) hours. 10/10/24   Augustus Almarie RAMAN, PA-C  amLODipine  (NORVASC ) 10 MG tablet Take 1 tablet (10 mg total) by mouth daily. 10/19/24   Almarie Waddell NOVAK, NP  meloxicam  (MOBIC ) 7.5 MG tablet Take 1 tablet (7.5 mg total) by mouth daily. 10/19/24   Almarie Waddell NOVAK, NP  Multiple Vitamin (MULTI-VITAMIN) tablet Take 1 tablet by mouth daily.    [provider]  oxyCODONE  (OXY IR/ROXICODONE ) 5 MG immediate release tablet Take 1 tablet (5 mg total) by mouth every 4 (four) hours as needed for moderate pain (pain score 4-6). 10/10/24   Augustus Almarie RAMAN, PA-C  polyethylene glycol powder (GLYCOLAX /MIRALAX ) 17 GM/SCOOP powder Take 17 g by mouth daily as needed (constipation). Dissolve 1 capful (17g) in 4-8 ounces of liquid and take by mouth daily. 10/10/24   Augustus Almarie RAMAN, PA-C  pyridOXINE  (B-6) 50 MG tablet Take 1 tablet (50 mg total) by mouth daily. 04/18/22   Comer, Lamar ORN, MD  sertraline  (ZOLOFT ) 100 MG tablet Take 2 tablets (200 mg total) by mouth daily. 09/01/24   Almarie Waddell NOVAK, NP  tiZANidine  (ZANAFLEX ) 4 MG tablet Take 1 tablet (4 mg total) by  mouth every 6 (six) hours as needed for muscle spasms. 10/19/24   Almarie Waddell NOVAK, NP  Upadacitinib  ER (RINVOQ ) 30 MG TB24 Take 1 tablet (30 mg total) by mouth daily. 05/09/24   May, Deanna J, NP    Allergies: Patient has no known allergies.    Review of Systems  All other systems reviewed and are negative.   Updated Vital Signs BP (!) 194/140 (BP Location: Right Arm)   Pulse (!) 123   Temp 98 F (36.7 C)   Resp (!) 24   Ht 1.829 m (6')   Wt 108.9 kg   SpO2 96%   BMI 32.56 kg/m   Physical Exam Vitals and nursing note reviewed.  Constitutional:      General: He is not in acute distress.    Appearance: Normal appearance. He is well-developed. He is not toxic-appearing.  HENT:     Head: Normocephalic and atraumatic.  Eyes:     General: Lids are normal.     Conjunctiva/sclera: Conjunctivae normal.     Pupils: Pupils are equal, round, and reactive to light.  Neck:     Thyroid : No thyroid  mass.     Trachea: No tracheal deviation.  Cardiovascular:     Rate and Rhythm: Regular rhythm. Tachycardia present.     Heart sounds: Normal heart sounds. No murmur heard.    No  gallop.  Pulmonary:     Effort: Pulmonary effort is normal. No respiratory distress.     Breath sounds: No stridor. Examination of the right-middle field reveals decreased breath sounds. Examination of the right-lower field reveals decreased breath sounds. Decreased breath sounds present. No wheezing, rhonchi or rales.  Abdominal:     General: There is no distension.     Palpations: Abdomen is soft.     Tenderness: There is no abdominal tenderness. There is no rebound.  Musculoskeletal:        General: No tenderness. Normal range of motion.     Cervical back: Normal range of motion and neck supple.  Skin:    General: Skin is warm and dry.     Findings: No abrasion or rash.  Neurological:     Mental Status: He is alert and oriented to person, place, and time. Mental status is at baseline.     GCS: GCS eye  subscore is 4. GCS verbal subscore is 5. GCS motor subscore is 6.     Cranial Nerves: No cranial nerve deficit.     Sensory: No sensory deficit.     Motor: Motor function is intact.  Psychiatric:        Attention and Perception: Attention normal.        Speech: Speech normal.        Behavior: Behavior normal.     (all labs ordered are listed, but only abnormal results are displayed) Labs Reviewed  COMPREHENSIVE METABOLIC PANEL WITH GFR - Abnormal; Notable for the following components:      Result Value   Potassium 3.1 (*)    CO2 21 (*)    Glucose, Bld 147 (*)    BUN <5 (*)    Calcium 8.7 (*)    AST 63 (*)    Anion gap 18 (*)    All other components within normal limits  CBC - Abnormal; Notable for the following components:   Platelets 570 (*)    All other components within normal limits    EKG: None  Radiology: DG Chest 2 View Result Date: 10/28/2024 CLINICAL DATA:  Fractured right ribs. EXAM: CHEST - 2 VIEW COMPARISON:  Chest CT 10/09/2024.  Chest x-ray 10/10/2024. FINDINGS: The heart size and mediastinal contours are within normal limits. Both lungs are clear. There are healing right ninth and tenth rib fractures visualized. IMPRESSION: 1. No active cardiopulmonary disease. 2. Healing right ninth and tenth rib fractures. Electronically Signed   By: Greig Pique M.D.   On: 10/28/2024 17:46     Procedures   Medications Ordered in the ED  morphine  (PF) 4 MG/ML injection 8 mg (has no administration in time range)                                    Medical Decision Making Amount and/or Complexity of Data Reviewed Labs: ordered. Radiology: ordered.  Risk Prescription drug management.   Patient medicated for pain here with morphine .  Chest x-ray showed increased right-sided pleural effusion.  Subsequently had a CT of his chest which showed a very large right sided likely hemothorax.  Will consult trauma surgery and patient will require admission     Final  diagnoses:  None    ED Discharge Orders     None          Dasie Faden, MD 10/28/24 2026

## 2024-10-28 NOTE — ED Provider Triage Note (Signed)
 Emergency Medicine Provider Triage Evaluation Note  Zachary [name] , a 43 y.o. male  was evaluated in triage.  Pt complains of worsening right sided flank pain and shortness of breath.  Patient had a fall on 10/09/2024.  He had sustained multiple broken ribs on the right side and a small pneumothorax.  Patient states that he was admitted overnight, did not require chest tube.  Denies new injuries or falls.  CT chest 10/09/24: 1. Multiple acute right rib fractures including the lateral aspects of the 8th and 9th ribs and a comminuted fracture of the posterior right 10th rib. 2. Small right anterior and lateral pneumothorax extending over the anterior right lung base.  Review of Systems  Positive: Shortness of breath, chest pain Negative:   Physical Exam  BP (!) 194/140 (BP Location: Right Arm)   Pulse (!) 123   Temp 98 F (36.7 C)   Resp (!) 24   Ht 6' (1.829 m)   Wt 108.9 kg   SpO2 96%   BMI 32.56 kg/m  Gen:   Awake, mild tachypnea Resp:  Appears slightly uncomfortable MSK:   Moves extremities without difficulty  Other:  Lung sounds decreased, especially low lung fields, right side.  Some air movement heard upper right.  Medical Decision Making  Medically screening exam initiated at 5:20 PM.  Appropriate orders placed.  Zachary [name] was informed that the remainder of the evaluation will be completed by another provider, this initial triage assessment does not replace that evaluation, and the importance of remaining in the ED until their evaluation is complete.  Asked that patient be moved back to a room as soon as possible for possible pneumothorax.   Zachary Chew, PA-C 10/28/24 1722

## 2024-10-28 NOTE — Procedures (Signed)
   Procedure Note  Date: 10/28/2024  Procedure: tube thoracostomy--right    Pre-op diagnosis: right hemothorax  Post-op diagnosis: same  Surgeon: Cordella Idler, MD  Anesthesia: local, versed  EBL: <87mL procedural; serosanguinous fluid evacuated Drains/Implants: 51F chest tube Specimen: none  Description of procedure: Time-out was performed verifying correct patient, procedure, site, laterality, and signature of informed consent. 5mL of local anesthetic was infiltrated into the tissues just over the fourth intercostal space.  Pigtail: A small skin nick was made at the fourth intercostal space. An introducer needle was inserted and a guidewire inserted through the needle. The needle was removed and the tract dilated. The chest tube was inserted over the guidewire and the guidewire removed.   The tube was secured at the skin with suture and connected to an atrium at -20cm water wall suction. Immediate output from the chest tube was and was serosanguinous. The site was dressed with xeroform, gauze, and tape. The patient tolerated the procedure well. There were no complications. Follow up chest x-ray was ordered to confirm tube positioning, complete evacuation, and complete lung re-expansion.    Cordella Idler, MD General and Trauma Surgery [facility]

## 2024-10-28 NOTE — H&P (Signed)
 Zachary [name] November 12, 1981  [id number].    HPI:  43 y/o M who presented with progressive dyspnea with exertion in the setting of recent admission 11/9-11/10 for management of right-sided rib fractures related to a fall. He reports that he had been doing well until this week when he noted positional dyspnea and fatigue. CT shows a moderate right-sided hemothorax. Hb 13.6 (11.2). He is AF and HDS.   ROS: Review of Systems  Constitutional: Negative.   HENT: Negative.    Eyes: Negative.   Respiratory:  Positive for shortness of breath.   Cardiovascular: Negative.   Gastrointestinal: Negative.   Genitourinary: Negative.   Musculoskeletal: Negative.   Skin: Negative.   Neurological: Negative.   Endo/Heme/Allergies: Negative.   Psychiatric/Behavioral: Negative.      Family History  Problem Relation Age of Onset   COPD Mother    Healthy Father    Ovarian cancer Paternal Aunt    Colon cancer Cousin    Colon polyps Neg Hx    Esophageal cancer Neg Hx    Rectal cancer Neg Hx    Stomach cancer Neg Hx     Past Medical History:  Diagnosis Date   Allergy    Anxiety    Depression    Hypertension    Ulcerative colitis (HCC)    Ulcerative colitis (HCC) 02/12/2022    Past Surgical History:  Procedure Laterality Date   COLONOSCOPY      Social History:  reports that he has never smoked. He has been exposed to tobacco smoke. He has quit using smokeless tobacco.  His smokeless tobacco use included snuff. He reports current alcohol use. He reports that he does not use drugs.  Allergies: No Known Allergies  (Not in a hospital admission)   Physical Exam: Blood pressure (!) 166/123, pulse 99, temperature 98 F (36.7 C), resp. rate 17, height 6' (1.829 m), weight 108.9 kg, SpO2 98%. Gen: male, NAD Resp: TTP along the right chest, no crepitus, no ecchymosis  Results for orders placed or performed during the hospital encounter of 10/28/24 (from the past 48 hours)   Comprehensive metabolic panel     Status: Abnormal   Collection Time: 10/28/24  5:17 PM  Result Value Ref Range   Sodium 140 135 - 145 mmol/L   Potassium 3.1 (L) 3.5 - 5.1 mmol/L   Chloride 101 98 - 111 mmol/L   CO2 21 (L) 22 - 32 mmol/L   Glucose, Bld 147 (H) 70 - 99 mg/dL    Comment: Glucose reference range applies only to samples taken after fasting for at least 8 hours.   BUN <5 (L) 6 - 20 mg/dL   Creatinine, Ser 9.01 0.61 - 1.24 mg/dL   Calcium 8.7 (L) 8.9 - 10.3 mg/dL   Total Protein 8.0 6.5 - 8.1 g/dL   Albumin 3.9 3.5 - 5.0 g/dL   AST 63 (H) 15 - 41 U/L   ALT 44 0 - 44 U/L   Alkaline Phosphatase 114 38 - 126 U/L   Total Bilirubin 0.9 0.0 - 1.2 mg/dL   GFR, Estimated >39 >39 mL/min    Comment: (NOTE) Calculated using the CKD-EPI Creatinine Equation (2021)    Anion gap 18 (H) 5 - 15    Comment: Performed at [facility], [address], [place], [place] [address]  CBC     Status: Abnormal   Collection Time: 10/28/24  5:17 PM  Result Value Ref Range   WBC 6.5 4.0 -  10.5 K/uL   RBC 4.25 4.22 - 5.81 MIL/uL   Hemoglobin 13.6 13.0 - 17.0 g/dL   HCT 59.5 60.9 - 47.9 %   MCV 95.1 80.0 - 100.0 fL   MCH 32.0 26.0 - 34.0 pg   MCHC 33.7 30.0 - 36.0 g/dL   RDW 84.5 88.4 - 84.4 %   Platelets 570 (H) 150 - 400 K/uL   nRBC 0.0 0.0 - 0.2 %    Comment: Performed at [facility], [address] [place], [place] [address]   CT Angio Chest PE W/Cm &/Or Wo Cm Result Date: 10/28/2024 CLINICAL DATA:  Worsening right flank pain and shortness of breath. Recent fall with multiple rib fractures and small pneumothorax EXAM: CT ANGIOGRAPHY CHEST WITH CONTRAST TECHNIQUE: Multidetector CT imaging of the chest was performed using the standard protocol during bolus administration of intravenous contrast. Multiplanar CT image reconstructions and MIPs were obtained to evaluate the vascular anatomy. RADIATION DOSE REDUCTION: This exam was performed according to the departmental  dose-optimization program which includes automated exposure control, adjustment of the mA and/or kV according to patient size and/or use of iterative reconstruction technique. CONTRAST:  75mL OMNIPAQUE  IOHEXOL  350 MG/ML SOLN COMPARISON:  Same day chest radiograph, CT chest dated 10/09/2024 FINDINGS: Cardiovascular: The study is high quality for the evaluation of pulmonary embolism. There are no filling defects in the central, lobar, segmental or subsegmental pulmonary artery branches to suggest acute pulmonary embolism. Great vessels are normal in course and caliber. Normal heart size. No significant pericardial fluid/thickening. Coronary artery calcifications. Mediastinum/Nodes: Imaged thyroid  gland without nodules meeting criteria for imaging follow-up by size. Normal esophagus. No pathologically enlarged axillary, supraclavicular, mediastinal, or hilar lymph nodes. Lungs/Pleura: The central airways are patent. Subsegmental right middle lobe and right lower lobe atelectasis. No pneumothorax. Increased size of moderate to large right pleural effusion, which appears mildly hyperattenuating. Upper abdomen: Normal. Musculoskeletal: Multiple right rib fractures, many of which were seen on 10/09/2024, for example: Healing posterior ninth (9: 108, 112) and tenth (9: 121). Interval increased displacement of multiple right rib fractures, including right lateral eighth (9:125) posterolateral ninth (9:126) and posterior tenth (9:134), with apparent new right anterolateral seventh rib fracture (9:115). Interval resolution of right chest wall subcutaneous emphysema. Review of the MIP images confirms the above findings. IMPRESSION: 1. No evidence of pulmonary embolism. 2. Increased size of moderate to large right pleural effusion, which appears mildly hyperattenuating, likely hemothorax. 3. Multiple right rib fractures, many of which were seen on 10/09/2024, however there is apparent new right anterolateral seventh rib  fracture, as well as increased displacement of multiple known rib fractures. 4. Interval resolution of right chest wall subcutaneous emphysema. 5. Coronary artery calcifications. Assessment for potential risk factor modification, dietary therapy or pharmacologic therapy may be warranted, if clinically indicated. Electronically Signed   By: Limin  Xu M.D.   On: 10/28/2024 20:10   DG Chest 2 View Result Date: 10/28/2024 CLINICAL DATA:  Fractured right ribs. EXAM: CHEST - 2 VIEW COMPARISON:  Chest CT 10/09/2024.  Chest x-ray 10/10/2024. FINDINGS: The heart size and mediastinal contours are within normal limits. Both lungs are clear. There are healing right ninth and tenth rib fractures visualized. IMPRESSION: 1. No active cardiopulmonary disease. 2. Healing right ninth and tenth rib fractures. Electronically Signed   By: Greig Pique M.D.   On: 10/28/2024 17:46    Assessment/Plan 43 y/o M with a right hemothorax in setting of recent rib fractures  - CT placed at bedside  w/ >1.2L out. Will keep to suction tonight - Repeat CXR in AM - Admit to med surg  FEN - Regular VTE - Lovenox  ID - None Admit - med/surg  Cordella DELENA Polly Marlis [facility] Surgery 10/28/2024, 9:06 PM Please see Amion for pager number during day hours 7:00am-4:30pm or 7:00am -11:30am on weekends

## 2024-10-28 NOTE — ED Triage Notes (Signed)
 The pt is c/o rt posterior rib pain where his rib fractures are although they were anterior.  Today he has had more difficulty breathing 02 sats good at present

## 2024-10-28 NOTE — ED Triage Notes (Signed)
 Patient states that he was here for broken ribs and punctured lung  a couple weeks ago. Now he is having sharp pain on the right side/ He states it is hard to take in a deep breathe and also hurts to inhale. He is experiencing pain from the from to the back on the right side.

## 2024-10-28 NOTE — ED Notes (Signed)
 Verbal consent obtained by RN for MSE

## 2024-10-28 NOTE — ED Notes (Signed)
 02 sats 98 on ra

## 2024-10-29 ENCOUNTER — Observation Stay (HOSPITAL_COMMUNITY)

## 2024-10-29 DIAGNOSIS — S2241XD Multiple fractures of ribs, right side, subsequent encounter for fracture with routine healing: Secondary | ICD-10-CM | POA: Diagnosis not present

## 2024-10-29 DIAGNOSIS — S272XXA Traumatic hemopneumothorax, initial encounter: Secondary | ICD-10-CM | POA: Diagnosis not present

## 2024-10-29 DIAGNOSIS — J90 Pleural effusion, not elsewhere classified: Secondary | ICD-10-CM | POA: Diagnosis not present

## 2024-10-29 DIAGNOSIS — K5190 Ulcerative colitis, unspecified, without complications: Secondary | ICD-10-CM | POA: Diagnosis not present

## 2024-10-29 DIAGNOSIS — S2231XD Fracture of one rib, right side, subsequent encounter for fracture with routine healing: Secondary | ICD-10-CM | POA: Diagnosis not present

## 2024-10-29 DIAGNOSIS — Z791 Long term (current) use of non-steroidal anti-inflammatories (NSAID): Secondary | ICD-10-CM | POA: Diagnosis not present

## 2024-10-29 DIAGNOSIS — S271XXA Traumatic hemothorax, initial encounter: Secondary | ICD-10-CM | POA: Diagnosis not present

## 2024-10-29 DIAGNOSIS — R1084 Generalized abdominal pain: Secondary | ICD-10-CM | POA: Diagnosis not present

## 2024-10-29 DIAGNOSIS — R918 Other nonspecific abnormal finding of lung field: Secondary | ICD-10-CM | POA: Diagnosis not present

## 2024-10-29 DIAGNOSIS — Z87891 Personal history of nicotine dependence: Secondary | ICD-10-CM | POA: Diagnosis not present

## 2024-10-29 DIAGNOSIS — Z825 Family history of asthma and other chronic lower respiratory diseases: Secondary | ICD-10-CM | POA: Diagnosis not present

## 2024-10-29 DIAGNOSIS — F32A Depression, unspecified: Secondary | ICD-10-CM | POA: Diagnosis not present

## 2024-10-29 DIAGNOSIS — E876 Hypokalemia: Secondary | ICD-10-CM | POA: Diagnosis not present

## 2024-10-29 DIAGNOSIS — F419 Anxiety disorder, unspecified: Secondary | ICD-10-CM | POA: Diagnosis not present

## 2024-10-29 DIAGNOSIS — I10 Essential (primary) hypertension: Secondary | ICD-10-CM | POA: Diagnosis not present

## 2024-10-29 DIAGNOSIS — Z79899 Other long term (current) drug therapy: Secondary | ICD-10-CM | POA: Diagnosis not present

## 2024-10-29 DIAGNOSIS — I2510 Atherosclerotic heart disease of native coronary artery without angina pectoris: Secondary | ICD-10-CM | POA: Diagnosis not present

## 2024-10-29 DIAGNOSIS — Z800 Family history of malignant neoplasm of digestive organs: Secondary | ICD-10-CM | POA: Diagnosis not present

## 2024-10-29 DIAGNOSIS — Z8041 Family history of malignant neoplasm of ovary: Secondary | ICD-10-CM | POA: Diagnosis not present

## 2024-10-29 DIAGNOSIS — R0602 Shortness of breath: Secondary | ICD-10-CM | POA: Diagnosis not present

## 2024-10-29 DIAGNOSIS — S2241XA Multiple fractures of ribs, right side, initial encounter for closed fracture: Secondary | ICD-10-CM | POA: Diagnosis not present

## 2024-10-29 LAB — BASIC METABOLIC PANEL WITH GFR
Anion gap: 12 (ref 5–15)
BUN: <5 mg/dL — ABNORMAL LOW (ref 6–20)
CO2: 24 mmol/L (ref 22–32)
Calcium: 8.3 mg/dL — ABNORMAL LOW (ref 8.9–10.3)
Chloride: 101 mmol/L (ref 98–111)
Creatinine, Ser: 0.95 mg/dL (ref 0.61–1.24)
GFR, Estimated: >60 mL/min (ref >60)
Glucose, Bld: 114 mg/dL — ABNORMAL HIGH (ref 70–99)
Potassium: 3.3 mmol/L — ABNORMAL LOW (ref 3.5–5.1)
Sodium: 137 mmol/L (ref 135–145)

## 2024-10-29 MED ORDER — MORPHINE SULFATE (PF) 4 MG/ML IV SOLN
4.0000 mg | INTRAVENOUS | Status: DC | PRN
  Administered 2024-10-29 (×2): 4 mg via INTRAVENOUS
  Filled 2024-10-29 (×2): qty 1

## 2024-10-29 MED ORDER — HYDRALAZINE HCL 20 MG/ML IJ SOLN
10.0000 mg | INTRAMUSCULAR | Status: DC | PRN
  Administered 2024-10-31 – 2024-11-02 (×2): 10 mg via INTRAVENOUS
  Filled 2024-10-29 (×2): qty 1

## 2024-10-29 MED ORDER — SERTRALINE HCL 100 MG PO TABS
200.0000 mg | ORAL_TABLET | Freq: Every day | ORAL | Status: DC
  Administered 2024-10-29 – 2024-11-02 (×5): 200 mg via ORAL
  Filled 2024-10-29 (×5): qty 2

## 2024-10-29 MED ORDER — METOPROLOL TARTRATE 5 MG/5ML IV SOLN: 5.0000 mg | Freq: Four times a day (QID) | INTRAVENOUS | Status: DC | PRN

## 2024-10-29 NOTE — Progress Notes (Signed)
 Transition of Care [facility]) - CAGE-AID Screening   Patient Details  Name: Zachary [name] MRN: [id number] Date of Birth: 03/15/1981  Transition of Care [facility]) CM/SW Contact:    Bernardino Mayotte, RN Phone Number: 10/29/2024, 6:11 AM   Clinical Narrative:  [place] some alcohol use, denies illicit drugs. Resources not given at this time.  CAGE-AID Screening:    Have You Ever Felt You Ought to Cut Down on Your Drinking or Drug Use?: No Have People Annoyed You By Critizing Your Drinking Or Drug Use?: No Have You Felt Bad Or Guilty About Your Drinking Or Drug Use?: No Have You Ever Had a Drink or Used Drugs First Thing In The Morning to Steady Your Nerves or to Get Rid of a Hangover?: No CAGE-AID Score: 0  Substance Abuse Education Offered: No

## 2024-10-29 NOTE — Progress Notes (Addendum)
 Subjective: Feeling better today breathing wise.  Having some pain around his chest tube site.  Didn't mention abdominal pain until I examined his abdomen and he flinched.  Says this isn't related to his UC.  It has been present for a couple of days, but no N/V, moving his bowels.  Somewhat vague when describing the pain.  Food does not make this worse.  ROS: See above, otherwise other systems negative  Objective: Vital signs in last 24 hours: Temp:  [98 F (36.7 C)-98.4 F (36.9 C)] 98.1 F (36.7 C) (11/29 1016) Pulse Rate:  [94-123] 101 (11/29 1016) Resp:  [13-24] 16 (11/29 1016) BP: (155-194)/(100-140) 158/100 (11/29 1016) SpO2:  [92 %-99 %] 95 % (11/29 1016) Weight:  [108.9 kg] 108.9 kg (11/28 1713) Last BM Date : 10/28/24  Intake/Output from previous day: 11/28 0701 - 11/29 0700 In: -  Out: 3270 [Urine:250; Chest Tube:3020] Intake/Output this shift: No intake/output data recorded.  PE: Gen: NAD Heart: regular Lungs: CTAB, CT on right side with 2900cc of serosang output in cannister, no air leak Abd: soft, mildly tender on right side of abdomen, ND, +BS Ext: MAEs Psych: A&Ox3  Lab Results:  Recent Labs    10/28/24 1717 10/28/24 2332  WBC 6.5 7.0  HGB 13.6 13.5  HCT 40.4 39.8  PLT 570* 520*   BMET Recent Labs    10/28/24 1717 10/28/24 2332  NA 140 137  K 3.1* 3.3*  CL 101 101  CO2 21* 24  GLUCOSE 147* 114*  BUN <5* <5*  CREATININE 0.98 0.95  CALCIUM 8.7* 8.3*   PT/INR No results for input(s): LABPROT, INR in the last 72 hours. CMP     Component Value Date/Time   NA 137 10/28/2024 2332   K 3.3 (L) 10/28/2024 2332   CL 101 10/28/2024 2332   CO2 24 10/28/2024 2332   GLUCOSE 114 (H) 10/28/2024 2332   BUN <5 (L) 10/28/2024 2332   CREATININE 0.95 10/28/2024 2332   CALCIUM 8.3 (L) 10/28/2024 2332   PROT 8.0 10/28/2024 1717   ALBUMIN 3.9 10/28/2024 1717   AST 63 (H) 10/28/2024 1717   ALT 44 10/28/2024 1717   ALKPHOS 114 10/28/2024  1717   BILITOT 0.9 10/28/2024 1717   GFRNONAA >60 10/28/2024 2332   Lipase  No results found for: LIPASE     Studies/Results: DG Chest Port 1 View Result Date: 10/29/2024 EXAM: 1 VIEW(S) XRAY OF THE CHEST 10/29/2024 04:38:00 AM COMPARISON: 10/28/2024 CLINICAL HISTORY: Chest tube in place. FINDINGS: LINES, TUBES AND DEVICES: Right pleural drainage catheter in place, unchanged. LUNGS AND PLEURA: Trace right apical pneumothorax, unchanged. Elevated right hemidiaphragm. No focal pulmonary opacity. No pleural effusion. HEART AND MEDIASTINUM: No acute abnormality of the cardiac and mediastinal silhouettes. BONES AND SOFT TISSUES: Displaced right lateral eighth and ninth rib fractures. IMPRESSION: 1. Trace right apical pneumothorax, unchanged. 2. Displaced right lateral eighth and ninth rib fractures. 3. Right pleural drainage catheter in place, unchanged. Electronically signed by: Evalene Coho MD 10/29/2024 04:49 AM EST RP Workstation: [id number]   DG CHEST PORT 1 VIEW Result Date: 10/28/2024 CLINICAL DATA:  Chest tube placement. EXAM: PORTABLE CHEST 1 VIEW COMPARISON:  October 28, 2024 (5:28 p.m.) FINDINGS: Since the prior study there is been interval placement of a right-sided chest tube with its distal tip overlying the mid to lower right lung. The heart size and mediastinal contours are within normal limits. A trace amount of linear atelectasis is noted within the  right lung base. No pleural effusion or pneumothorax is identified. Seventh, eighth and ninth right rib fractures are noted. IMPRESSION: 1. Interval right-sided chest tube placement without evidence of a pneumothorax or residual pleural effusion. 2. Right-sided rib fractures. Electronically Signed   By: Suzen Dials M.D.   On: 10/28/2024 22:42   CT Angio Chest PE W/Cm &/Or Wo Cm Result Date: 10/28/2024 CLINICAL DATA:  Worsening right flank pain and shortness of breath. Recent fall with multiple rib fractures and small  pneumothorax EXAM: CT ANGIOGRAPHY CHEST WITH CONTRAST TECHNIQUE: Multidetector CT imaging of the chest was performed using the standard protocol during bolus administration of intravenous contrast. Multiplanar CT image reconstructions and MIPs were obtained to evaluate the vascular anatomy. RADIATION DOSE REDUCTION: This exam was performed according to the departmental dose-optimization program which includes automated exposure control, adjustment of the mA and/or kV according to patient size and/or use of iterative reconstruction technique. CONTRAST:  75mL OMNIPAQUE  IOHEXOL  350 MG/ML SOLN COMPARISON:  Same day chest radiograph, CT chest dated 10/09/2024 FINDINGS: Cardiovascular: The study is high quality for the evaluation of pulmonary embolism. There are no filling defects in the central, lobar, segmental or subsegmental pulmonary artery branches to suggest acute pulmonary embolism. Great vessels are normal in course and caliber. Normal heart size. No significant pericardial fluid/thickening. Coronary artery calcifications. Mediastinum/Nodes: Imaged thyroid  gland without nodules meeting criteria for imaging follow-up by size. Normal esophagus. No pathologically enlarged axillary, supraclavicular, mediastinal, or hilar lymph nodes. Lungs/Pleura: The central airways are patent. Subsegmental right middle lobe and right lower lobe atelectasis. No pneumothorax. Increased size of moderate to large right pleural effusion, which appears mildly hyperattenuating. Upper abdomen: Normal. Musculoskeletal: Multiple right rib fractures, many of which were seen on 10/09/2024, for example: Healing posterior ninth (9: 108, 112) and tenth (9: 121). Interval increased displacement of multiple right rib fractures, including right lateral eighth (9:125) posterolateral ninth (9:126) and posterior tenth (9:134), with apparent new right anterolateral seventh rib fracture (9:115). Interval resolution of right chest wall subcutaneous  emphysema. Review of the MIP images confirms the above findings. IMPRESSION: 1. No evidence of pulmonary embolism. 2. Increased size of moderate to large right pleural effusion, which appears mildly hyperattenuating, likely hemothorax. 3. Multiple right rib fractures, many of which were seen on 10/09/2024, however there is apparent new right anterolateral seventh rib fracture, as well as increased displacement of multiple known rib fractures. 4. Interval resolution of right chest wall subcutaneous emphysema. 5. Coronary artery calcifications. Assessment for potential risk factor modification, dietary therapy or pharmacologic therapy may be warranted, if clinically indicated. Electronically Signed   By: Limin  Xu M.D.   On: 10/28/2024 20:10   DG Chest 2 View Result Date: 10/28/2024 CLINICAL DATA:  Fractured right ribs. EXAM: CHEST - 2 VIEW COMPARISON:  Chest CT 10/09/2024.  Chest x-ray 10/10/2024. FINDINGS: The heart size and mediastinal contours are within normal limits. Both lungs are clear. There are healing right ninth and tenth rib fractures visualized. IMPRESSION: 1. No active cardiopulmonary disease. 2. Healing right ninth and tenth rib fractures. Electronically Signed   By: Greig Pique M.D.   On: 10/28/2024 17:46    Anti-infectives: Anti-infectives (From admission, onward)    None        Assessment/Plan Recent fall with rib fractures with a new right hemothorax - s/p chest tube placement.  High output.  Continue chest tube for now.  CXR in am.  Continue on suction today, but may be able to waterseal tomorrow. -mobilize -pulm  toilet -labs in am Abdominal pain, h/o UC - patient states this isn't like his UC pain.  Unclear etiology, but labs are unremarkable, he's eating, etc.  Will monitor for now. HTN - home meds, add prn meds given BP still elevated FEN - regular, K was low at 3.3, will recheck in am and replete as needed VTE - Lovenox  ID - none  I reviewed nursing notes, last 24  h vitals and pain scores, last 48 h intake and output, last 24 h labs and trends, and last 24 h imaging results.   LOS: 0 days    Burnard FORBES Banter , [facility] 10/29/2024, 12:01 PM Please see Amion for pager number during day hours 7:00am-4:30pm or 7:00am -11:30am on weekends

## 2024-10-29 NOTE — Progress Notes (Signed)
 Patient arrived to floor, alert and oriented, R sided chest tube at -20cm water wall suction, 1520 ml serosanguinous drainage. No sob, on room air, BP elevated, Provider notified, order received. C/o of soreness at chest tube site, Prn pain meds adm with mild relief.

## 2024-10-29 NOTE — Plan of Care (Signed)

## 2024-10-30 ENCOUNTER — Inpatient Hospital Stay (HOSPITAL_COMMUNITY)

## 2024-10-30 DIAGNOSIS — R1084 Generalized abdominal pain: Secondary | ICD-10-CM | POA: Diagnosis not present

## 2024-10-30 DIAGNOSIS — J90 Pleural effusion, not elsewhere classified: Secondary | ICD-10-CM | POA: Diagnosis not present

## 2024-10-30 DIAGNOSIS — J9811 Atelectasis: Secondary | ICD-10-CM | POA: Diagnosis not present

## 2024-10-30 DIAGNOSIS — S2249XA Multiple fractures of ribs, unspecified side, initial encounter for closed fracture: Secondary | ICD-10-CM | POA: Diagnosis not present

## 2024-10-30 DIAGNOSIS — I10 Essential (primary) hypertension: Secondary | ICD-10-CM | POA: Diagnosis not present

## 2024-10-30 DIAGNOSIS — Z4682 Encounter for fitting and adjustment of non-vascular catheter: Secondary | ICD-10-CM | POA: Diagnosis not present

## 2024-10-30 LAB — CBC
HCT: 40.2 % (ref 39.0–52.0)
Hemoglobin: 13 g/dL (ref 13.0–17.0)
MCH: 31.7 pg (ref 26.0–34.0)
MCHC: 32.3 g/dL (ref 30.0–36.0)
MCV: 98 fL (ref 80.0–100.0)
Platelets: 440 K/uL — ABNORMAL HIGH (ref 150–400)
RBC: 4.1 MIL/uL — ABNORMAL LOW (ref 4.22–5.81)
RDW: 15.7 % — ABNORMAL HIGH (ref 11.5–15.5)
WBC: 7.2 K/uL (ref 4.0–10.5)
nRBC: 0 % (ref 0.0–0.2)

## 2024-10-30 LAB — BASIC METABOLIC PANEL WITH GFR
Anion gap: 13 (ref 5–15)
BUN: 10 mg/dL (ref 6–20)
CO2: 24 mmol/L (ref 22–32)
Calcium: 8.5 mg/dL — ABNORMAL LOW (ref 8.9–10.3)
Chloride: 102 mmol/L (ref 98–111)
Creatinine, Ser: 1.14 mg/dL (ref 0.61–1.24)
GFR, Estimated: >60 mL/min (ref >60)
Glucose, Bld: 119 mg/dL — ABNORMAL HIGH (ref 70–99)
Potassium: 3.4 mmol/L — ABNORMAL LOW (ref 3.5–5.1)
Sodium: 139 mmol/L (ref 135–145)

## 2024-10-30 MED ORDER — POTASSIUM CHLORIDE CRYS ER 20 MEQ PO TBCR
40.0000 meq | EXTENDED_RELEASE_TABLET | Freq: Once | ORAL | Status: AC
  Administered 2024-10-30: 40 meq via ORAL
  Filled 2024-10-30: qty 2

## 2024-10-30 MED ORDER — METHOCARBAMOL 500 MG PO TABS
500.0000 mg | ORAL_TABLET | Freq: Three times a day (TID) | ORAL | Status: DC
  Administered 2024-10-30 – 2024-11-02 (×10): 500 mg via ORAL
  Filled 2024-10-30 (×10): qty 1

## 2024-10-30 MED ORDER — DIPHENHYDRAMINE HCL 50 MG/ML IJ SOLN
25.0000 mg | Freq: Four times a day (QID) | INTRAMUSCULAR | Status: DC | PRN
  Administered 2024-10-30 – 2024-10-31 (×2): 25 mg via INTRAVENOUS
  Filled 2024-10-30 (×2): qty 1

## 2024-10-30 MED ORDER — MORPHINE SULFATE (PF) 2 MG/ML IV SOLN
2.0000 mg | INTRAVENOUS | Status: DC | PRN
  Administered 2024-10-30 – 2024-11-01 (×4): 2 mg via INTRAVENOUS
  Filled 2024-10-30 (×5): qty 1

## 2024-10-30 NOTE — Plan of Care (Signed)

## 2024-10-30 NOTE — Plan of Care (Signed)
  Problem: Education: Goal: Knowledge of General Education information will improve Description: Including pain rating scale, medication(s)/side effects and non-pharmacologic comfort measures Outcome: Progressing   Problem: Clinical Measurements: Goal: Will remain free from infection Outcome: Progressing   Problem: Nutrition: Goal: Adequate nutrition will be maintained Outcome: Progressing   Problem: Coping: Goal: Level of anxiety will decrease Outcome: Progressing   Problem: Safety: Goal: Ability to remain free from injury will improve Outcome: Progressing   Problem: Skin Integrity: Goal: Risk for impaired skin integrity will decrease Outcome: Progressing   

## 2024-10-30 NOTE — Progress Notes (Signed)
 Subjective: Continuing to feel better and asking if he is going home today.  Abdominal pain resolved.  Eating well with no issues.  No SOB  ROS: See above, otherwise other systems negative  Objective: Vital signs in last 24 hours: Temp:  [98.1 F (36.7 C)-98.8 F (37.1 C)] 98.8 F (37.1 C) (11/30 0531) Pulse Rate:  [93-104] 93 (11/30 0531) Resp:  [16-18] 18 (11/30 0531) BP: (113-158)/(89-100) 113/89 (11/30 0531) SpO2:  [94 %-96 %] 96 % (11/30 0531) Last BM Date : 10/28/24  Intake/Output from previous day: No intake/output data recorded. Intake/Output this shift: No intake/output data recorded.  PE: Gen: NAD Heart: regular Lungs: CTAB, CT on right side with no air leak, had a total of 3L out of chest in last 36 hrs.  Difficult to tell exactly how much he had out yesterday.  Cannister is at 1700 on the 3rd column (shortest column) today to help gauze amount for tomorrow Abd: soft, NT, ND, +BS Ext: MAEs Psych: A&Ox3  Lab Results:  Recent Labs    10/28/24 2332 10/30/24 0120  WBC 7.0 7.2  HGB 13.5 13.0  HCT 39.8 40.2  PLT 520* 440*   BMET Recent Labs    10/28/24 2332 10/30/24 0120  NA 137 139  K 3.3* 3.4*  CL 101 102  CO2 24 24  GLUCOSE 114* 119*  BUN <5* 10  CREATININE 0.95 1.14  CALCIUM 8.3* 8.5*   PT/INR No results for input(s): LABPROT, INR in the last 72 hours. CMP     Component Value Date/Time   NA 139 10/30/2024 0120   K 3.4 (L) 10/30/2024 0120   CL 102 10/30/2024 0120   CO2 24 10/30/2024 0120   GLUCOSE 119 (H) 10/30/2024 0120   BUN 10 10/30/2024 0120   CREATININE 1.14 10/30/2024 0120   CALCIUM 8.5 (L) 10/30/2024 0120   PROT 8.0 10/28/2024 1717   ALBUMIN 3.9 10/28/2024 1717   AST 63 (H) 10/28/2024 1717   ALT 44 10/28/2024 1717   ALKPHOS 114 10/28/2024 1717   BILITOT 0.9 10/28/2024 1717   GFRNONAA >60 10/30/2024 0120   Lipase  No results found for: LIPASE     Studies/Results: DG CHEST PORT 1 VIEW Result Date:  10/30/2024 EXAM: 1 VIEW(S) XRAY OF THE CHEST 10/30/2024 05:25:00 AM COMPARISON: Compare portable chest yesterday at 4:34 am. CLINICAL HISTORY: [id number] Pleural effusion on right [id number] Pleural effusion on right. FINDINGS: LINES, TUBES AND DEVICES: Right pleural tube remains in place. LUNGS AND PLEURA: Right perihilar linear atelectasis. The lungs are clear of infiltrates. No substantial pleural effusion. No measurable pneumothorax. Elevated right hemidiaphragm again noted. HEART AND MEDIASTINUM: No acute abnormality of the cardiac and mediastinal silhouettes. BONES AND SOFT TISSUES: Displaced right 8th and 9th rib fractures were better demonstrated on yesterday's film. No new osseous findings. IMPRESSION: 1. Right perihilar linear atelectasis. 2. No substantial pleural effusion. No measurable pneumothorax. 3. Displaced right 8th and 9th rib fractures, better demonstrated on yesterday's film. No new osseous findings. Electronically signed by: Francis Quam MD 10/30/2024 05:37 AM EST RP Workstation: [id number]   DG Chest Port 1 View Result Date: 10/29/2024 EXAM: 1 VIEW(S) XRAY OF THE CHEST 10/29/2024 04:38:00 AM COMPARISON: 10/28/2024 CLINICAL HISTORY: Chest tube in place. FINDINGS: LINES, TUBES AND DEVICES: Right pleural drainage catheter in place, unchanged. LUNGS AND PLEURA: Trace right apical pneumothorax, unchanged. Elevated right hemidiaphragm. No focal pulmonary opacity. No pleural effusion. HEART AND MEDIASTINUM: No acute abnormality of the cardiac and mediastinal  silhouettes. BONES AND SOFT TISSUES: Displaced right lateral eighth and ninth rib fractures. IMPRESSION: 1. Trace right apical pneumothorax, unchanged. 2. Displaced right lateral eighth and ninth rib fractures. 3. Right pleural drainage catheter in place, unchanged. Electronically signed by: Evalene Coho MD 10/29/2024 04:49 AM EST RP Workstation: [id number]   DG CHEST PORT 1 VIEW Result Date: 10/28/2024 CLINICAL DATA:  Chest tube  placement. EXAM: PORTABLE CHEST 1 VIEW COMPARISON:  October 28, 2024 (5:28 p.m.) FINDINGS: Since the prior study there is been interval placement of a right-sided chest tube with its distal tip overlying the mid to lower right lung. The heart size and mediastinal contours are within normal limits. A trace amount of linear atelectasis is noted within the right lung base. No pleural effusion or pneumothorax is identified. Seventh, eighth and ninth right rib fractures are noted. IMPRESSION: 1. Interval right-sided chest tube placement without evidence of a pneumothorax or residual pleural effusion. 2. Right-sided rib fractures. Electronically Signed   By: Suzen Dials M.D.   On: 10/28/2024 22:42   CT Angio Chest PE W/Cm &/Or Wo Cm Result Date: 10/28/2024 CLINICAL DATA:  Worsening right flank pain and shortness of breath. Recent fall with multiple rib fractures and small pneumothorax EXAM: CT ANGIOGRAPHY CHEST WITH CONTRAST TECHNIQUE: Multidetector CT imaging of the chest was performed using the standard protocol during bolus administration of intravenous contrast. Multiplanar CT image reconstructions and MIPs were obtained to evaluate the vascular anatomy. RADIATION DOSE REDUCTION: This exam was performed according to the departmental dose-optimization program which includes automated exposure control, adjustment of the mA and/or kV according to patient size and/or use of iterative reconstruction technique. CONTRAST:  75mL OMNIPAQUE  IOHEXOL  350 MG/ML SOLN COMPARISON:  Same day chest radiograph, CT chest dated 10/09/2024 FINDINGS: Cardiovascular: The study is high quality for the evaluation of pulmonary embolism. There are no filling defects in the central, lobar, segmental or subsegmental pulmonary artery branches to suggest acute pulmonary embolism. Great vessels are normal in course and caliber. Normal heart size. No significant pericardial fluid/thickening. Coronary artery calcifications.  Mediastinum/Nodes: Imaged thyroid  gland without nodules meeting criteria for imaging follow-up by size. Normal esophagus. No pathologically enlarged axillary, supraclavicular, mediastinal, or hilar lymph nodes. Lungs/Pleura: The central airways are patent. Subsegmental right middle lobe and right lower lobe atelectasis. No pneumothorax. Increased size of moderate to large right pleural effusion, which appears mildly hyperattenuating. Upper abdomen: Normal. Musculoskeletal: Multiple right rib fractures, many of which were seen on 10/09/2024, for example: Healing posterior ninth (9: 108, 112) and tenth (9: 121). Interval increased displacement of multiple right rib fractures, including right lateral eighth (9:125) posterolateral ninth (9:126) and posterior tenth (9:134), with apparent new right anterolateral seventh rib fracture (9:115). Interval resolution of right chest wall subcutaneous emphysema. Review of the MIP images confirms the above findings. IMPRESSION: 1. No evidence of pulmonary embolism. 2. Increased size of moderate to large right pleural effusion, which appears mildly hyperattenuating, likely hemothorax. 3. Multiple right rib fractures, many of which were seen on 10/09/2024, however there is apparent new right anterolateral seventh rib fracture, as well as increased displacement of multiple known rib fractures. 4. Interval resolution of right chest wall subcutaneous emphysema. 5. Coronary artery calcifications. Assessment for potential risk factor modification, dietary therapy or pharmacologic therapy may be warranted, if clinically indicated. Electronically Signed   By: Limin  Xu M.D.   On: 10/28/2024 20:10   DG Chest 2 View Result Date: 10/28/2024 CLINICAL DATA:  Fractured right ribs. EXAM: CHEST - 2 VIEW  COMPARISON:  Chest CT 10/09/2024.  Chest x-ray 10/10/2024. FINDINGS: The heart size and mediastinal contours are within normal limits. Both lungs are clear. There are healing right ninth and  tenth rib fractures visualized. IMPRESSION: 1. No active cardiopulmonary disease. 2. Healing right ninth and tenth rib fractures. Electronically Signed   By: Greig Pique M.D.   On: 10/28/2024 17:46    Anti-infectives: Anti-infectives (From admission, onward)    None        Assessment/Plan Recent fall with rib fractures with a new right hemothorax - s/p chest tube placement.  High output initially.  Waterseal today.  CXR looks good today, if output less than 100cc for next 24 hrs, hopefully DC chest tube tomorrow -mobilize -pulm toilet -labs in am Abdominal pain, h/o UC - resolved.  Eating well HTN - home meds, add prn meds given BP still elevated FEN - regular, kdur today, BMET in am VTE - Lovenox  ID - none  I reviewed nursing notes, last 24 h vitals and pain scores, last 48 h intake and output, last 24 h labs and trends, and last 24 h imaging results.   LOS: 1 day    Burnard FORBES Banter , [facility] 10/30/2024, 9:53 AM Please see Amion for pager number during day hours 7:00am-4:30pm or 7:00am -11:30am on weekends

## 2024-10-30 NOTE — Progress Notes (Signed)
 Pt c/o itching on face and body, no visible rash noted. Pt states he think its from pain meds. Provider notified, Benadryl 25 mg IV administered. Potassium 3.4 today, has been running low past 2 days. Pt experienced some leg cramping last night and says he normally takes potassium at home. Provider notified.

## 2024-10-30 NOTE — Plan of Care (Signed)
   Problem: Clinical Measurements: Goal: Will remain free from infection Outcome: Progressing   Problem: Pain Managment: Goal: General experience of comfort will improve and/or be controlled Outcome: Progressing   Problem: Safety: Goal: Ability to remain free from injury will improve Outcome: Progressing

## 2024-10-31 ENCOUNTER — Other Ambulatory Visit: Payer: Self-pay

## 2024-10-31 ENCOUNTER — Other Ambulatory Visit: Payer: Self-pay | Admitting: Pharmacy Technician

## 2024-10-31 ENCOUNTER — Other Ambulatory Visit (HOSPITAL_COMMUNITY): Payer: Self-pay

## 2024-10-31 DIAGNOSIS — I10 Essential (primary) hypertension: Secondary | ICD-10-CM | POA: Diagnosis not present

## 2024-10-31 DIAGNOSIS — R1084 Generalized abdominal pain: Secondary | ICD-10-CM | POA: Diagnosis not present

## 2024-10-31 DIAGNOSIS — S271XXA Traumatic hemothorax, initial encounter: Secondary | ICD-10-CM | POA: Diagnosis not present

## 2024-10-31 LAB — BASIC METABOLIC PANEL WITH GFR
Anion gap: 10 (ref 5–15)
BUN: 17 mg/dL (ref 6–20)
CO2: 25 mmol/L (ref 22–32)
Calcium: 9.5 mg/dL (ref 8.9–10.3)
Chloride: 101 mmol/L (ref 98–111)
Creatinine, Ser: 0.92 mg/dL (ref 0.61–1.24)
GFR, Estimated: >60 mL/min (ref >60)
Glucose, Bld: 103 mg/dL — ABNORMAL HIGH (ref 70–99)
Potassium: 3.7 mmol/L (ref 3.5–5.1)
Sodium: 136 mmol/L (ref 135–145)

## 2024-10-31 MED ORDER — DIPHENHYDRAMINE HCL 25 MG PO CAPS
25.0000 mg | ORAL_CAPSULE | Freq: Four times a day (QID) | ORAL | Status: DC | PRN
  Administered 2024-11-02: 25 mg via ORAL
  Filled 2024-10-31: qty 1

## 2024-10-31 MED ORDER — RINVOQ 30 MG PO TB24
30.0000 mg | ORAL_TABLET | Freq: Every day | ORAL | 5 refills | Status: AC
  Filled 2024-10-31 (×2): qty 30, 30d supply, fill #0
  Filled 2024-11-23: qty 30, 30d supply, fill #1
  Filled 2024-12-21: qty 30, 30d supply, fill #2

## 2024-10-31 NOTE — TOC Initial Note (Signed)
 Transition of Care [facility]) - Initial/Assessment Note    Patient Details  Name: Zachary [name] MRN: [id number] Date of Birth: 08-Aug-1981  Transition of [facility]) CM/SW Contact:    Calia Napp M, RN Phone Number: 10/31/2024, 11:50am  Clinical Narrative:                 43 y/o M who presented with progressive dyspnea with exertion in the setting of recent admission 11/9-11/10 for management of right-sided rib fractures related to a fall. He reports that he had been doing well until this week when he noted positional dyspnea and fatigue. CT shows a moderate right-sided hemothorax.  PTA, pt independent and living at home alone.  He states that his family lives out of state and that he can manage well by himself. His PCP is Waddell Mon.  He states that he may need assistance with transportation at discharge.  Will follow as patient progresses.   Expected Discharge Plan: Home/Self Care Barriers to Discharge: Continued Medical Work up              Expected Discharge Plan and Services   Discharge Planning Services: CM Consult   Living arrangements for the past 2 months: Apartment                                      Prior Living Arrangements/Services Living arrangements for the past 2 months: Apartment Lives with:: Self Patient language and need for interpreter reviewed:: Yes Do you feel safe going back to the place where you live?: Yes      Need for Family Participation in Patient Care: Yes (Comment) Care giver support system in place?: Yes (comment)   Criminal Activity/Legal Involvement Pertinent to Current Situation/Hospitalization: No - Comment as needed  Activities of Daily Living   ADL Screening (condition at time of admission) Independently performs ADLs?: Yes (appropriate for developmental age) Is the patient deaf or have difficulty hearing?: No Does the patient have difficulty seeing, even when wearing glasses/contacts?: No Does the patient have  difficulty concentrating, remembering, or making decisions?: No                 Emotional Assessment Appearance:: Appears stated age Attitude/Demeanor/Rapport: Engaged Affect (typically observed): Accepting Orientation: : Oriented to Self, Oriented to Place, Oriented to  Time, Oriented to Situation      Admission diagnosis:  Hemothorax on right [J94.2] Patient Active Problem List   Diagnosis Date Noted   Hemothorax on right 10/28/2024   Rib fracture 10/09/2024   TB lung, 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 [place] [address] Phone: [phone] Fax: [phone]  [facility] [address] [place] [address] Phone: [phone] Fax: [phone]  [facility] [address] [place] [address] Phone: [phone] Fax: [phone]     Social Drivers of Health (SDOH) Social History: SDOH Screenings   Food Insecurity: No Food Insecurity (10/29/2024)  Housing: Low Risk  (10/29/2024)  Recent Concern: Housing - High Risk (10/09/2024)  Transportation Needs: No Transportation Needs (10/29/2024)  Utilities: Not At Risk (10/29/2024)  Alcohol Screen:  High Risk (05/03/2024)  Depression (PHQ2-9): High Risk (10/19/2024)  Financial Resource Strain: Low Risk  (05/03/2024)  Physical Activity: Unknown (05/03/2024)  Social Connections: Socially Isolated (05/03/2024)  Stress: No Stress Concern Present (05/03/2024)  Tobacco Use: Medium Risk (10/28/2024)   SDOH Interventions:     Readmission Risk Interventions     No data to display         [name], RN, BSN  Trauma/Neuro ICU Case Manager [phone]

## 2024-10-31 NOTE — Progress Notes (Signed)
 Subjective: NAEO. Denies pain at present. No reported urinary sxs. Last BM 1-2 days ago.  ROS: See above, otherwise other systems negative  Objective: Vital signs in last 24 hours: Temp:  [98 F (36.7 C)-98.4 F (36.9 C)] 98.4 F (36.9 C) (12/01 0758) Pulse Rate:  [91-99] 99 (12/01 0758) Resp:  [16-18] 18 (12/01 0547) BP: (128-167)/(88-106) 167/92 (12/01 0758) SpO2:  [94 %-98 %] 94 % (12/01 0758) Last BM Date : 10/28/24  Intake/Output from previous day: No intake/output data recorded. Intake/Output this shift: No intake/output data recorded.  PE: Gen: NAD Heart: regular Lungs: CTAB, CT on right side with no air leak, 255 mL/24h SS  Abd: soft, NT, ND, +BS Ext: MAEs Psych: A&Ox3  Lab Results:  Recent Labs    10/28/24 2332 10/30/24 0120  WBC 7.0 7.2  HGB 13.5 13.0  HCT 39.8 40.2  PLT 520* 440*   BMET Recent Labs    10/30/24 0120 10/31/24 0538  NA 139 136  K 3.4* 3.7  CL 102 101  CO2 24 25  GLUCOSE 119* 103*  BUN 10 17  CREATININE 1.14 0.92  CALCIUM 8.5* 9.5   PT/INR No results for input(s): LABPROT, INR in the last 72 hours. CMP     Component Value Date/Time   NA 136 10/31/2024 0538   K 3.7 10/31/2024 0538   CL 101 10/31/2024 0538   CO2 25 10/31/2024 0538   GLUCOSE 103 (H) 10/31/2024 0538   BUN 17 10/31/2024 0538   CREATININE 0.92 10/31/2024 0538   CALCIUM 9.5 10/31/2024 0538   PROT 8.0 10/28/2024 1717   ALBUMIN 3.9 10/28/2024 1717   AST 63 (H) 10/28/2024 1717   ALT 44 10/28/2024 1717   ALKPHOS 114 10/28/2024 1717   BILITOT 0.9 10/28/2024 1717   GFRNONAA >60 10/31/2024 0538   Lipase  No results found for: LIPASE     Studies/Results: DG CHEST PORT 1 VIEW Result Date: 10/30/2024 EXAM: 1 VIEW(S) XRAY OF THE CHEST 10/30/2024 05:25:00 AM COMPARISON: Compare portable chest yesterday at 4:34 am. CLINICAL HISTORY: [id number] Pleural effusion on right [id number] Pleural effusion on right. FINDINGS: LINES, TUBES AND DEVICES: Right pleural  tube remains in place. LUNGS AND PLEURA: Right perihilar linear atelectasis. The lungs are clear of infiltrates. No substantial pleural effusion. No measurable pneumothorax. Elevated right hemidiaphragm again noted. HEART AND MEDIASTINUM: No acute abnormality of the cardiac and mediastinal silhouettes. BONES AND SOFT TISSUES: Displaced right 8th and 9th rib fractures were better demonstrated on yesterday's film. No new osseous findings. IMPRESSION: 1. Right perihilar linear atelectasis. 2. No substantial pleural effusion. No measurable pneumothorax. 3. Displaced right 8th and 9th rib fractures, better demonstrated on yesterday's film. No new osseous findings. Electronically signed by: Francis Quam MD 10/30/2024 05:37 AM EST RP Workstation: [id number]    Anti-infectives: Anti-infectives (From admission, onward)    None        Assessment/Plan Recent fall with rib fractures with a new right hemothorax - s/p chest tube placement.  High output initially.  Waterseal today.  CXR looks good 11/30, continue chest tube today due to high volume (255 mL/24h), hopefully can remove tomorrow 12/2 if output decreased.  -mobilize -pulm toilet -labs in am Abdominal pain, h/o UC - resolved.  Eating well HTN - home meds, add prn meds given BP still elevated FEN - regular, hypokalemia resolved (K 3.7 today from 3.4 yesterday) VTE - Lovenox  ID - none  I reviewed nursing notes, last 24 h vitals and  pain scores, last 48 h intake and output, last 24 h labs and trends, and last 24 h imaging results.   LOS: 2 days    Almarie GORMAN Pringle , [facility] Surgery 10/31/2024, 8:36 AM Please see Amion for pager number during day hours 7:00am-4:30pm or 7:00am -11:30am on weekends

## 2024-10-31 NOTE — Progress Notes (Signed)
 Specialty Pharmacy Refill Coordination Note  Zachary [name] is a 43 y.o. male contacted today regarding refills of specialty medication(s) Rinvoq   Patient requested Delivery Delivery date: 11/03/2024 Verified address: [address] [place], [place] [address]   Medication will be filled on: 11/02/2024

## 2024-11-01 ENCOUNTER — Inpatient Hospital Stay (HOSPITAL_COMMUNITY)

## 2024-11-01 DIAGNOSIS — R9389 Abnormal findings on diagnostic imaging of other specified body structures: Secondary | ICD-10-CM | POA: Diagnosis not present

## 2024-11-01 DIAGNOSIS — J939 Pneumothorax, unspecified: Secondary | ICD-10-CM | POA: Diagnosis not present

## 2024-11-01 DIAGNOSIS — S271XXA Traumatic hemothorax, initial encounter: Secondary | ICD-10-CM | POA: Diagnosis not present

## 2024-11-01 DIAGNOSIS — S2249XA Multiple fractures of ribs, unspecified side, initial encounter for closed fracture: Secondary | ICD-10-CM | POA: Diagnosis not present

## 2024-11-01 DIAGNOSIS — Z4682 Encounter for fitting and adjustment of non-vascular catheter: Secondary | ICD-10-CM | POA: Diagnosis not present

## 2024-11-01 DIAGNOSIS — I10 Essential (primary) hypertension: Secondary | ICD-10-CM | POA: Diagnosis not present

## 2024-11-01 DIAGNOSIS — R1084 Generalized abdominal pain: Secondary | ICD-10-CM | POA: Diagnosis not present

## 2024-11-01 MED ORDER — OXYCODONE HCL 5 MG PO TABS
5.0000 mg | ORAL_TABLET | ORAL | Status: DC | PRN
  Administered 2024-11-02 (×2): 10 mg via ORAL
  Filled 2024-11-01 (×2): qty 2

## 2024-11-01 MED ORDER — LIDOCAINE HCL 2 % IJ SOLN
INTRAMUSCULAR | Status: AC
  Filled 2024-11-01: qty 20

## 2024-11-01 NOTE — Progress Notes (Cosign Needed Addendum)
 Subjective: CC Patient reports pain only around chest tube site. Still requiring IV pain medications. No sob. Tolerating diet without n/v. BM yesterday. Voiding. Mobilizing.   Wants chest tube out and to go home today. Reports he needs to go home to feed his cats and has groceries outside his door. Reports he does not have any friends or family nearby to help.   Objective: Vital signs in last 24 hours: Temp:  [98.3 F (36.8 C)-99 F (37.2 C)] 98.8 F (37.1 C) (12/02 0529) Pulse Rate:  [92-95] 92 (12/02 0529) Resp:  [16-18] 17 (12/02 0529) BP: (139-172)/(82-108) 172/108 (12/02 0529) SpO2:  [94 %-98 %] 94 % (12/02 0529) Last BM Date : 10/30/24  Intake/Output from previous day: 12/01 0701 - 12/02 0700 In: 840 [P.O.:840] Out: 485 [Chest Tube:485] Intake/Output this shift: No intake/output data recorded.  PE: Gen: NAD Heart: regular Lungs: CTAB, CT on right side with no air leak, 485 mL/24h SS. Cannister currently at 550cc. RN reports dayshift marked output at 500cc before change of shift yesterday.  Abd: soft, NT, ND Ext: MAEs Psych: A&Ox3  Lab Results:  Recent Labs    10/30/24 0120  WBC 7.2  HGB 13.0  HCT 40.2  PLT 440*   BMET Recent Labs    10/30/24 0120 10/31/24 0538  NA 139 136  K 3.4* 3.7  CL 102 101  CO2 24 25  GLUCOSE 119* 103*  BUN 10 17  CREATININE 1.14 0.92  CALCIUM 8.5* 9.5   PT/INR No results for input(s): LABPROT, INR in the last 72 hours. CMP     Component Value Date/Time   NA 136 10/31/2024 0538   K 3.7 10/31/2024 0538   CL 101 10/31/2024 0538   CO2 25 10/31/2024 0538   GLUCOSE 103 (H) 10/31/2024 0538   BUN 17 10/31/2024 0538   CREATININE 0.92 10/31/2024 0538   CALCIUM 9.5 10/31/2024 0538   PROT 8.0 10/28/2024 1717   ALBUMIN 3.9 10/28/2024 1717   AST 63 (H) 10/28/2024 1717   ALT 44 10/28/2024 1717   ALKPHOS 114 10/28/2024 1717   BILITOT 0.9 10/28/2024 1717   GFRNONAA >60 10/31/2024 0538   Lipase  No results found  for: LIPASE     Studies/Results: No results found.   Anti-infectives: Anti-infectives (From admission, onward)    None        Assessment/Plan Recent fall with rib fractures with a new right hemothorax - s/p chest tube placement 11/28.  On WS. CXR looks good 11/30. Still high volume (485cc/24 hours) but output tapering over the last 12 hours.  Continue chest tube today due to high volume. Hopefully can remove tomorrow if output decreased. Mobilize. Pulm toilet Abdominal pain, h/o UC - resolved.  Eating well. Reached out to pharmacy about restarting Rinvoq  HTN - home meds. Continue prn meds given BP still elevated. Asked RN to repeat BP now incase needs PRN meds as BP elevated overnight. Consider adding additional meds to home regimen if remains elevated.  FEN - Regular VTE - Lovenox  ID - none   Addendum: Came back to patients room. Patient reporting he understands that we recommend continuing chest tube today given high output over the last 24 hours, but is considering still leaving so he can return home to feed his cats. I discussed this would be AMA. We discussed that leaving before medically stable for d/c could lead to, but was not limited to, severe pain, recurrent hemothorax and death.  The patient  appears to have the capacity to make their decision and understood the consequences if he was to leave AMA. The patient stated understanding of above. He is willing to stay for now. I will discuss further with my attending.  I reviewed nursing notes, last 24 h vitals and pain scores, last 48 h intake and output, last 24 h labs and trends, and last 24 h imaging results.   LOS: 3 days    Zachary [name] , [facility] Surgery 11/01/2024, 8:20 AM Please see Amion for pager number during day hours 7:00am-4:30pm or 7:00am -11:30am on weekends

## 2024-11-01 NOTE — Plan of Care (Signed)

## 2024-11-01 NOTE — Plan of Care (Signed)
  Problem: Clinical Measurements: Goal: Will remain free from infection Outcome: Progressing   Problem: Clinical Measurements: Goal: Respiratory complications will improve Outcome: Progressing   Problem: Pain Managment: Goal: General experience of comfort will improve and/or be controlled Outcome: Progressing

## 2024-11-01 NOTE — Progress Notes (Signed)
 Patient concerned about discharge early tomorrow - has elderly cats that he is concerned about.  Supplies placed in room in preparation of chest tube removal,  Stated he did not need the muscle relaxer or tylenol 

## 2024-11-02 ENCOUNTER — Other Ambulatory Visit (HOSPITAL_COMMUNITY): Payer: Self-pay

## 2024-11-02 ENCOUNTER — Inpatient Hospital Stay (HOSPITAL_COMMUNITY)

## 2024-11-02 DIAGNOSIS — R1084 Generalized abdominal pain: Secondary | ICD-10-CM | POA: Diagnosis not present

## 2024-11-02 DIAGNOSIS — J939 Pneumothorax, unspecified: Secondary | ICD-10-CM | POA: Diagnosis not present

## 2024-11-02 DIAGNOSIS — Z4682 Encounter for fitting and adjustment of non-vascular catheter: Secondary | ICD-10-CM | POA: Diagnosis not present

## 2024-11-02 DIAGNOSIS — J90 Pleural effusion, not elsewhere classified: Secondary | ICD-10-CM | POA: Diagnosis not present

## 2024-11-02 DIAGNOSIS — S2249XA Multiple fractures of ribs, unspecified side, initial encounter for closed fracture: Secondary | ICD-10-CM | POA: Diagnosis not present

## 2024-11-02 DIAGNOSIS — J9811 Atelectasis: Secondary | ICD-10-CM | POA: Diagnosis not present

## 2024-11-02 DIAGNOSIS — I10 Essential (primary) hypertension: Secondary | ICD-10-CM | POA: Diagnosis not present

## 2024-11-02 MED ORDER — OXYCODONE HCL 5 MG PO TABS
5.0000 mg | ORAL_TABLET | Freq: Four times a day (QID) | ORAL | 0 refills | Status: DC | PRN
  Filled 2024-11-02: qty 15, 2d supply, fill #0

## 2024-11-02 MED ORDER — METHOCARBAMOL 500 MG PO TABS
500.0000 mg | ORAL_TABLET | Freq: Three times a day (TID) | ORAL | 0 refills | Status: AC | PRN
  Filled 2024-11-02: qty 30, 10d supply, fill #0

## 2024-11-02 MED ORDER — ACETAMINOPHEN 500 MG PO TABS: 1000.0000 mg | ORAL_TABLET | Freq: Three times a day (TID) | ORAL | Status: AC | PRN

## 2024-11-02 NOTE — Plan of Care (Signed)

## 2024-11-02 NOTE — Plan of Care (Signed)
  Problem: Education: Goal: Knowledge of General Education information will improve Description: Including pain rating scale, medication(s)/side effects and non-pharmacologic comfort measures Outcome: Progressing   Problem: Health Behavior/Discharge Planning: Goal: Ability to manage health-related needs will improve Outcome: Progressing   Problem: Activity: Goal: Risk for activity intolerance will decrease Outcome: Progressing   Problem: Coping: Goal: Level of anxiety will decrease Outcome: Progressing   Problem: Nutrition: Goal: Adequate nutrition will be maintained Outcome: Progressing   Problem: Pain Managment: Goal: General experience of comfort will improve and/or be controlled Outcome: Progressing   Problem: Safety: Goal: Ability to remain free from injury will improve Outcome: Progressing

## 2024-11-02 NOTE — Progress Notes (Signed)
 Subjective: CC: Patient reports pain at chest tube site. Otherwise no pain. Pain well controlled. No longer needing IV pain medications. Tolerating po without n/v. BM yesterday. Voiding. Mobilizing.   Objective: Vital signs in last 24 hours: Temp:  [98.3 F (36.8 C)-99.5 F (37.5 C)] 98.8 F (37.1 C) (12/03 0125) Pulse Rate:  [85-103] 103 (12/03 0603) Resp:  [17-20] 18 (12/03 0125) BP: (147-177)/(88-112) 147/100 (12/03 0603) SpO2:  [92 %-98 %] 95 % (12/03 0603) Last BM Date : 11/01/24  Intake/Output from previous day: 12/02 0701 - 12/03 0700 In: 960 [P.O.:960] Out: 0  Intake/Output this shift: No intake/output data recorded.  PE: Gen: NAD Heart: regular on my exam Lungs: CTAB, CT on right side with no air leak, 83mL/24h SS. Cannister currently at 550cc.  Abd: Soft, NT, ND Psych: A&Ox3  Lab Results:  No results for input(s): WBC, HGB, HCT, PLT in the last 72 hours. BMET Recent Labs    10/31/24 0538  NA 136  K 3.7  CL 101  CO2 25  GLUCOSE 103*  BUN 17  CREATININE 0.92  CALCIUM 9.5   PT/INR No results for input(s): LABPROT, INR in the last 72 hours. CMP     Component Value Date/Time   NA 136 10/31/2024 0538   K 3.7 10/31/2024 0538   CL 101 10/31/2024 0538   CO2 25 10/31/2024 0538   GLUCOSE 103 (H) 10/31/2024 0538   BUN 17 10/31/2024 0538   CREATININE 0.92 10/31/2024 0538   CALCIUM 9.5 10/31/2024 0538   PROT 8.0 10/28/2024 1717   ALBUMIN 3.9 10/28/2024 1717   AST 63 (H) 10/28/2024 1717   ALT 44 10/28/2024 1717   ALKPHOS 114 10/28/2024 1717   BILITOT 0.9 10/28/2024 1717   GFRNONAA >60 10/31/2024 0538   Lipase  No results found for: LIPASE  Studies/Results: DG CHEST PORT 1 VIEW Result Date: 11/01/2024 CLINICAL DATA:  Chest tube in place. EXAM: PORTABLE CHEST 1 VIEW COMPARISON:  10/30/2024. FINDINGS: Right pleural tube in similar position. Trace right apical pneumothorax. No sizable pleural effusion. No focal consolidation.  Similar elevation of the right hemidiaphragm. Stable cardiomediastinal contours. Redemonstrated displaced right lateral eighth and ninth rib fractures. IMPRESSION: 1. Trace right apical pneumothorax. 2. Right pleural tube in similar position. 3. Displaced right lateral eighth and ninth rib fractures. Electronically Signed   By: Harrietta Sherry M.D.   On: 11/01/2024 11:58    Anti-infectives: Anti-infectives (From admission, onward)    None        Assessment/Plan Recent fall with rib fractures with a new right hemothorax - s/p chest tube placement 11/28.  On WS. Remained in place for high volume. No output from chest tube over the last 24 hours. No air leak. CXR today appears stable with trace right apical ptx on my read. Will confirm with attending before removing.  Abdominal pain, h/o UC - resolved.  Eating well. Plan to start Rinvoq  today if remains inpatient vs restart at d/c.  HTN - Continue home amlodipine . Elevated here and requiring PRN meds. Reports he was previously on lisinopril  but taken off because BP was in good range. BP was 108/80 at recent visit with PCP on 11/19. Favor patient monitoring serial BP's at home with close follow up with PCP to determine if needs additional BP medications restarted for home regimen. He is agreeable. Will send message to PCP.  FEN - Regular VTE - Lovenox  ID - none  I reviewed nursing notes, last 24 h  vitals and pain scores, last 48 h intake and output, last 24 h labs and trends, and last 24 h imaging results.     LOS: 4 days    Zachary [name], [facility] Surgery 11/02/2024, 7:28 AM Please see Amion for pager number during day hours 7:00am-4:30pm

## 2024-11-02 NOTE — Progress Notes (Signed)
 Trauma Event Note  Chest tube removal requested by Trauma PA Michael, removal order confirmed. Patient vitals stable prior to removal, pain medication given by bedside RN. Chest tube removed with no complications, patient tolerated procedure well. Vitals remained stable throughout. Follow up CXR ordered for 1300. Patient hopes he can go home today.  Last imported Vital Signs BP (!) 147/100   Pulse (!) 103   Temp 98.8 F (37.1 C) (Oral)   Resp 18   Ht 6' (1.829 m)   Wt 108.9 kg   SpO2 95%   BMI 32.56 kg/m   Zachary [name] Breslyn Abdo  Trauma Response RN  Please call TRN at [phone] for further assistance.

## 2024-11-02 NOTE — Discharge Summary (Signed)
 Patient ID: Zachary [name] [id number] [birth year] 43 y.o.  Admit date: 10/28/2024 Discharge date: 11/02/2024  Admitting Diagnosis: 43 y/o M with a right hemothorax in setting of recent rib fractures  Discharge Diagnosis Recent fall with rib fractures with a new right hemothorax Hx HTN Hx UC  Consultants None  HPI: 43 y/o M who presented with progressive dyspnea with exertion in the setting of recent admission 11/9-11/10 for management of right-sided rib fractures related to a fall. He reports that he had been doing well until this week when he noted positional dyspnea and fatigue. CT shows a moderate right-sided hemothorax. Hb 13.6 (11.2). He is AF and HDS.   Procedures Zachary [name] - 10/28/24 Tube thoracostomy--right     Hospital Course:  Presented as above was found to have moderate right sided hemothorax.  Right chest tube was placed on 11/28. Serial chest xrays were monitored and once chest output decreased and pneumothorax improved the chest tube was removed. Follow-up chest xray stable. On 12/3, the patient was voiding well, tolerating diet, ambulating well, pain well controlled, vital signs stable and felt stable for discharge home. Patient noted to have elevated blood pressures during admission requiring prn medications despite being on home regimen. D/c BP 126/91. Recommended patient follow BP closely at home, document BP's and follow up with PCP to ensure he does not need home regimen adjusted. Message also sent to patients PCP regarding this. Discussed discharge instructions, restrictions and return/call back precautions. Follow up as noted below.    Allergies as of 11/02/2024   No Known Allergies      Medication List     PAUSE taking these medications    meloxicam  7.5 MG tablet Wait to take this until your doctor or other care provider tells you to start again. Commonly known as: MOBIC  Take 1 tablet (7.5 mg total) by mouth daily.   POTASSIMIN PO Wait to  take this until your doctor or other care provider tells you to start again. Take 1 tablet by mouth daily.       TAKE these medications    acetaminophen  500 MG tablet Commonly known as: TYLENOL  Take 2 tablets (1,000 mg total) by mouth every 8 (eight) hours as needed. What changed:  how much to take when to take this reasons to take this   albuterol  108 (90 Base) MCG/ACT inhaler Commonly known as: VENTOLIN  HFA Inhale 2 puffs into the lungs every 6 (six) hours as needed for wheezing or shortness of breath.   amLODipine  10 MG tablet Commonly known as: NORVASC  Take 1 tablet (10 mg total) by mouth daily.   methocarbamol  500 MG tablet Commonly known as: ROBAXIN  Take 1 tablet (500 mg total) by mouth every 8 (eight) hours as needed for muscle spasms.   Multi-Vitamin tablet Take 1 tablet by mouth daily.   oxyCODONE  5 MG immediate release tablet Commonly known as: Oxy IR/ROXICODONE  Take 1-2 tablets (5-10 mg total) by mouth every 6 (six) hours as needed for moderate pain (pain score 4-6) or severe pain (pain score 7-10) (5mg  for moderate pain, 10mg  for severe pain).   polyethylene glycol powder 17 GM/SCOOP powder Commonly known as: GLYCOLAX /MIRALAX  Take 17 g by mouth daily as needed (constipation). Dissolve 1 capful (17g) in 4-8 ounces of liquid and take by mouth daily.   Rinvoq  30 MG Tb24 Generic drug: Upadacitinib  ER Take 1 tablet (30 mg total) by mouth daily.   sertraline  100 MG tablet Commonly known as: ZOLOFT  Take 2 tablets (200 mg  total) by mouth daily.          Follow-up Information     Zachary Waddell NOVAK, NP. Call in 1 day(s).   Specialties: Family Medicine, Emergency Medicine Why: To arrange follow up regarding your blood pressure Contact information: [address] [place] [address] [phone]         [facility] Follow up.   Why: We will call you with your follow up chest xray results. Contact information: [address] [place]  [address] [phone]                Signed: Ozell CHRISTELLA [name], [facility] Surgery 11/02/2024, 3:18 PM Please see Amion for pager number during day hours 7:00am-4:30pm

## 2024-11-02 NOTE — TOC Transition Note (Signed)
 Transition of Care [facility]) - Discharge Note   Patient Details  Name: Zachary [name] MRN: [id number] Date of Birth: 1981/01/27  [facility] CM/SW Contact:  Jammie Clink M, RN Phone Number: 11/02/2024, 3:50 PM   Clinical Narrative:    Patient medically stable for discharge home today. Patient denies any needs for home; plans to follow up with his PCP ASAP regarding elevated blood pressures.  Patient states he plans to take an [place] home.       Barriers to Discharge: Barriers Resolved            Discharge Plan and Services Additional resources added to the After Visit Summary for     Discharge Planning Services: CM Consult                                 Social Drivers of Health (SDOH) Interventions SDOH Screenings   Food Insecurity: No Food Insecurity (10/29/2024)  Housing: Low Risk  (10/29/2024)  Recent Concern: Housing - High Risk (10/09/2024)  Transportation Needs: No Transportation Needs (10/29/2024)  Utilities: Not At Risk (10/29/2024)  Alcohol Screen: High Risk (05/03/2024)  Depression (PHQ2-9): High Risk (10/19/2024)  Financial Resource Strain: Low Risk  (05/03/2024)  Physical Activity: Unknown (05/03/2024)  Social Connections: Socially Isolated (05/03/2024)  Stress: No Stress Concern Present (05/03/2024)  Tobacco Use: Medium Risk (10/28/2024)     Readmission Risk Interventions     No data to display         [name], RN, BSN  Trauma/Neuro ICU Case Manager [phone]

## 2024-11-02 NOTE — Discharge Instructions (Signed)
PNEUMOTHORAX OR HEMOTHORAX +/- RIB FRACTURES  HOME INSTRUCTIONS   PAIN CONTROL:  Pain is best controlled by a usual combination of three different methods TOGETHER:  Ice/Heat Over the counter pain medication Prescription pain medication You may experience some swelling and bruising in area of broken ribs. Ice packs or heating pads (30-60 minutes up to 6 times a day) will help. Use ice for the first few days to help decrease swelling and bruising, then switch to heat to help relax tight/sore spots and speed recovery. Some people prefer to use ice alone, heat alone, alternating between ice & heat. Experiment to what works for you. Swelling and bruising can take several weeks to resolve.  It is helpful to take an over-the-counter pain medication regularly for the first few weeks. Choose one of the following that works best for you:  Naproxen (Aleve, etc) Two 220mg  tabs twice a day Ibuprofen (Advil, etc) Three 200mg  tabs four times a day (every meal & bedtime) Acetaminophen (Tylenol, etc) 500-650mg  four times a day (every meal & bedtime) A prescription for pain medication (such as oxycodone, hydrocodone, etc) may be given to you upon discharge. Take your pain medication as prescribed.  If you are having problems/concerns with the prescription medicine (does not control pain, nausea, vomiting, rash, itching, etc), please call us [phone] to see if we need to switch you to a different pain medicine that will work better for you and/or control your side effect better. If you need a refill on your pain medication, please contact your pharmacy. They will contact our office to request authorization. Prescriptions will not be filled after 5 pm or on week-ends. Avoid getting constipated. When taking pain medications, it is common to experience some constipation. Increasing fluid intake and taking a fiber supplement (such as Metamucil, Citrucel, FiberCon, MiraLax, etc) 1-2 times a day regularly will  usually help prevent this problem from occurring. A mild laxative (prune juice, Milk of Magnesia, MiraLax, etc) should be taken according to package directions if there are no bowel movements after 48 hours.  Watch out for diarrhea. If you have many loose bowel movements, simplify your diet to bland foods & liquids for a few days. Stop any stool softeners and decrease your fiber supplement. Switching to mild anti-diarrheal medications (Kayopectate, Pepto Bismol) can help. If this worsens or does not improve, please call us. Chest tube site wound: you may remove the dressing from your chest tube site 3 days after the removal of your chest tube. DO NOT shower over the dressing. Once   removed, you may shower as normal. Do not submerge your wound in water for 2-3 weeks.  FOLLOW UP  Please call our office to set up or confirm an appointment for follow up for 2 weeks after discharge. You will need to get a chest xray at either [facility] or [facility]. This will be outlined in your follow up instructions. Please call [facility] at [phone] if you have any questions about follow up.  If you have any orthopedic or other injuries you will need to follow up as outlined in your follow up instructions.   WHEN TO CALL us [phone]:  Poor pain control Reactions / problems with new medications (rash/itching, nausea, etc)  Fever over 101.5 F (38.5 C) Worsening swelling or bruising Redness, drainage, pain or swelling around chest tube site Worsening pain, productive cough, difficulty breathing or any other concerning symptoms  The clinic staff is available to answer your questions during  regular business hours (8:30am-5pm). Please don't hesitate to call and ask to speak to one of our nurses for clinical concerns.  If you have a medical emergency, go to the nearest emergency room or call 911.  A surgeon from [facility] is always on call at the [facility], [place], [place], [place],  [address] ?  MAIN: [phone] ? TOLL FREE: [phone] ?  FAX [phone]  [email or web address]      Information on Rib Fractures  A rib fracture is a break or crack in one of the bones of the ribs. The ribs are long, curved bones that wrap around your chest and attach to your spine and your breastbone. The ribs protect your heart, lungs, and other organs in the chest. A broken or cracked rib is often painful but is not usually serious. Most rib fractures heal on their own over time. However, rib fractures can be more serious if multiple ribs are broken or if broken ribs move out of place and push against other structures or organs. What are the causes? This condition is caused by: Repetitive movements with high force, such as pitching a baseball or having severe coughing spells. A direct blow to the chest, such as a sports injury, a car accident, or a fall. Cancer that has spread to the bones, which can weaken bones and cause them to break. What are the signs or symptoms? Symptoms of this condition include: Pain when you breathe in or cough. Pain when someone presses on the injured area. Feeling short of breath. How is this diagnosed? This condition is diagnosed with a physical exam and medical history. Imaging tests may also be done, such as: Chest X-ray. CT scan. MRI. Bone scan. Chest ultrasound. How is this treated? Treatment for this condition depends on the severity of the fracture. Most rib fractures usually heal on their own in 1-3 months. Sometimes healing takes longer if there is a cough that does not stop or if there are other activities that make the injury worse (aggravating factors). While you heal, you will be given medicines to control the pain. You will also be taught deep breathing exercises. Severe injuries may require hospitalization or surgery. Follow these instructions at home: Managing pain,  stiffness, and swelling If directed, apply ice to the injured area. Put ice in a plastic bag. Place a towel between your skin and the bag. Leave the ice on for 20 minutes, 2-3 times a day. Take over-the-counter and prescription medicines only as told by your health care provider. Activity Avoid a lot of activity and any activities or movements that cause pain. Be careful during activities and avoid bumping the injured rib. Slowly increase your activity as told by your health care provider. General instructions Do deep breathing exercises as told by your health care provider. This helps prevent pneumonia, which is a common complication of a broken rib. Your health care provider may instruct you to: Take deep breaths several times a day. Try to cough several times a day, holding a pillow against the injured area. Use a device called incentive spirometer to practice deep breathing several times a day. Drink enough fluid to keep your urine pale yellow. Do not wear a rib belt or binder. These restrict breathing, which can lead to pneumonia. Keep all follow-up visits as told by your health care provider. This is important. Contact a health care provider if: You have a fever. Get  help right away if: You have difficulty breathing or you are short of breath. You develop a cough that does not stop, or you cough up thick or bloody sputum. You have nausea, vomiting, or pain in your abdomen. Your pain gets worse and medicine does not help. Summary A rib fracture is a break or crack in one of the bones of the ribs. A broken or cracked rib is often painful but is not usually serious. Most rib fractures heal on their own over time. Treatment for this condition depends on the severity of the fracture. Avoid a lot of activity and any activities or movements that cause pain. This information is not intended to replace advice given to you by your health care provider. Make sure you discuss any questions  you have with your health care provider. Document Released: 11/17/2005 Document Revised: 02/16/2017 Document Reviewed: 02/16/2017 Elsevier Interactive Patient Education  2019 Else[employer] Inc[employer]    Pneumothorax A pneumothorax is commonly called a collapsed lung. It is a condition in which air leaks from a lung and builds up between the thin layer of tissue that covers the lungs (visceral pleura) and the interior wall of the chest cavity (parietal pleura). The air gets trapped outside the lung, between the lung and the chest wall (pleural space). The air takes up space and prevents the lung from fully expanding. This condition sometimes occurs suddenly with no apparent cause. The buildup of air may be small or large. A small pneumothorax may go away on its own. A large pneumothorax will require treatment and hospitalization. What are the causes? This condition may be caused by: Trauma and injury to the chest wall. Surgery and other medical procedures. A complication of an underlying lung problem, especially chronic obstructive pulmonary disease (COPD) or emphysema. Sometimes the cause of this condition is not known. What increases the risk? You are more likely to develop this condition if: You have an underlying lung problem. You smoke. You are 85-36 years old, male, tall, and underweight. You have a personal or family history of pneumothorax. You have an eating disorder (anorexia nervosa). This condition can also happen quickly, even in people with no history of lung problems. What are the signs or symptoms? Sometimes a pneumothorax will have no symptoms. When symptoms are present, they can include: Chest pain. Shortness of breath. Increased rate of breathing. Bluish color to your lips or skin (cyanosis). How is this diagnosed? This condition may be diagnosed by: A medical history and physical exam. A chest X-ray, chest CT scan, or ultrasound. How is this treated? Treatment depends on  how severe your condition is. The goal of treatment is to remove the extra air and allow your lung to expand back to its normal size. For a small pneumothorax: No treatment may be needed. Extra oxygen is sometimes used to make it go away more quickly. For a large pneumothorax or a pneumothorax that is causing symptoms, a procedure is done to drain the air from your lungs. To do this, a health care provider may use: A needle with a syringe. This is used to suck air from a pleural space where no additional leakage is taking place. A chest tube. This is used to suck air where there is ongoing leakage into the pleural space. The chest tube may need to remain in place for several days until the air leak has healed. In more severe cases, surgery may be needed to repair the damage that is causing the leak. If you  have multiple pneumothorax episodes or have an air leak that will not heal, a procedure called a pleurodesis may be done. A medicine is placed in the pleural space to irritate the tissues around the lung so that the lung will stick to the chest wall, seal any leaks, and stop any buildup of air in that space. If you have an underlying lung problem, severe symptoms, or a large pneumothorax you will usually need to stay in the hospital. Follow these instructions at home: Lifestyle Do not use any products that contain nicotine or tobacco, such as cigarettes and e-cigarettes. These are major risk factors in pneumothorax. If you need help quitting, ask your health care provider. Do not lift anything that is heavier than 10 lb (4.5 kg), or the limit that your health care provider tells you, until he or she says that it is safe. Avoid activities that take a lot of effort (strenuous) for as long as told by your health care provider. Return to your normal activities as told by your health care provider. Ask your health care provider what activities are safe for you. Do not fly in an airplane or scuba dive  until your health care provider says it is okay. General instructions Take over-the-counter and prescription medicines only as told by your health care provider. If a cough or pain makes it difficult for you to sleep at night, try sleeping in a semi-upright position in a recliner or by using 2 or 3 pillows. If you had a chest tube and it was removed, ask your health care provider when you can remove the bandage (dressing). While the dressing is in place, do not allow it to get wet. Keep all follow-up visits as told by your health care provider. This is important. Contact a health care provider if: You cough up thick mucus (sputum) that is yellow or green in color. You were treated with a chest tube, and you have redness, increasing pain, or discharge at the site where it was placed. Get help right away if: You have increasing chest pain or shortness of breath. You have a cough that will not go away. You begin coughing up blood. You have pain that is getting worse or is not controlled with medicines. The site where your chest tube was located opens up. You feel air coming out of the site where the chest tube was placed. You have a fever or persistent symptoms for more than 2-3 days. You have a fever and your symptoms suddenly get worse. These symptoms may represent a serious problem that is an emergency. Do not wait to see if the symptoms will go away. Get medical help right away. Call your local emergency services (911 in the [place] Do not drive yourself to the hospital. Summary A pneumothorax, commonly called a collapsed lung, is a condition in which air leaks from a lung and gets trapped between the lung and the chest wall (pleural space). The buildup of air may be small or large. A small pneumothorax may go away on its own. A large pneumothorax will require treatment and hospitalization. Treatment for this condition depends on how severe the pneumothorax is. The goal of treatment is to  remove the extra air and allow the lung to expand back to its normal size. This information is not intended to replace advice given to you by your health care provider. Make sure you discuss any questions you have with your health care provider. Document Released: 11/17/2005 Document Revised: 10/26/2017 Document  Reviewed: 10/26/2017 Elsevier Interactive Patient Education  [employer].

## 2024-11-03 ENCOUNTER — Telehealth: Payer: Self-pay

## 2024-11-03 NOTE — Transitions of Care (Post Inpatient/ED Visit) (Unsigned)
   11/03/2024  Name: Zachary [name] MRN: [id number] DOB: 07-06-1981  Today's TOC FU Call Status: Today's TOC FU Call Status:: Unsuccessful Call (1st Attempt) Unsuccessful Call (1st Attempt) Date: 11/03/24  Attempted to reach the patient regarding the most recent Inpatient/ED visit.  Follow Up Plan: Additional outreach attempts will be made to reach the patient to complete the Transitions of Care (Post Inpatient/ED visit) call.   Signature Julian Lemmings, LPN [facility] Nurse Health Advisor Direct Dial [phone]

## 2024-11-04 NOTE — Transitions of Care (Post Inpatient/ED Visit) (Unsigned)
   11/04/2024  Name: Zachary [name] MRN: [id number] DOB: [birth year]  Today's TOC FU Call Status: Today's TOC FU Call Status:: Unsuccessful Call (2nd Attempt) Unsuccessful Call (1st Attempt) Date: 11/03/24 Unsuccessful Call (2nd Attempt) Date: 11/04/24  Attempted to reach the patient regarding the most recent Inpatient/ED visit.  Follow Up Plan: Additional outreach attempts will be made to reach the patient to complete the Transitions of Care (Post Inpatient/ED visit) call.   Signature Julian Lemmings, LPN [facility] Nurse Health Advisor Direct Dial [phone]

## 2024-11-07 NOTE — Transitions of Care (Post Inpatient/ED Visit) (Signed)
   11/07/2024  Name: Wheeler Incorvaia MRN: [id number] DOB: Jun 07, 1981  Today's TOC FU Call Status: Today's TOC FU Call Status:: Unsuccessful Call (3rd Attempt) Unsuccessful Call (1st Attempt) Date: 11/03/24 Unsuccessful Call (2nd Attempt) Date: 11/04/24 Unsuccessful Call (3rd Attempt) Date: 11/07/24  Attempted to reach the patient regarding the most recent Inpatient/ED visit.  Follow Up Plan: No further outreach attempts will be made at this time. We have been unable to contact the patient.  Signature Julian Lemmings, LPN [facility] Nurse Health Advisor Direct Dial [phone]

## 2024-11-09 ENCOUNTER — Ambulatory Visit: Admitting: Family Medicine

## 2024-11-09 ENCOUNTER — Other Ambulatory Visit (HOSPITAL_BASED_OUTPATIENT_CLINIC_OR_DEPARTMENT_OTHER): Payer: Self-pay

## 2024-11-09 ENCOUNTER — Encounter: Payer: Self-pay | Admitting: Family Medicine

## 2024-11-09 VITALS — BP 131/79 | HR 103 | Temp 98.0°F | Ht 73.0 in | Wt 239.6 lb

## 2024-11-09 DIAGNOSIS — J942 Hemothorax: Secondary | ICD-10-CM | POA: Diagnosis not present

## 2024-11-09 DIAGNOSIS — I10 Essential (primary) hypertension: Secondary | ICD-10-CM | POA: Diagnosis not present

## 2024-11-09 DIAGNOSIS — Z09 Encounter for follow-up examination after completed treatment for conditions other than malignant neoplasm: Secondary | ICD-10-CM | POA: Diagnosis not present

## 2024-11-09 DIAGNOSIS — E876 Hypokalemia: Secondary | ICD-10-CM | POA: Diagnosis not present

## 2024-11-09 MED ORDER — BLOOD PRESSURE UNIT DEVI
1.0000 | Freq: Every day | 0 refills | Status: AC
  Filled 2024-11-09: qty 1, 30d supply, fill #0
  Filled 2024-11-09: qty 1, fill #0

## 2024-11-09 NOTE — Progress Notes (Signed)
 Acute Office Visit  Subjective:  Patient ID: Zachary [name], male    DOB: 05/07/1981  Age: 43 y.o. MRN: [id number]  CC:  Chief Complaint  Patient presents with   Follow-up    Hospital follow up Elevated blood pressure while in the hospital      HPI Zachary [name] is here for hospital follow-up. Seen 10/28/2024 in emergency department with progressive dyspnea with exertion for 1 week, subsequent to another hospitalization on 11/09-11/10 for right-sided rib fractures after falling in the shower. CT showed a moderate right-sided hemothorax and chest tube was placed. Blood pressures were noted to be high during admission, but improving by discharge day (11/02/24). He was encouraged to monitor BP at home and follow-up with PCP. Last CXR on discharge day showed tiny residual right apical pneumothorax and right basilar atelectasis. He has not been monitoring his BP at home. Reports breathing is much better and pain is resolved.       Past Medical History:  Diagnosis Date   Allergy    Anxiety    Depression    Hypertension    Ulcerative colitis (HCC)    Ulcerative colitis (HCC) 02/12/2022    Past Surgical History:  Procedure Laterality Date   COLONOSCOPY      Family History  Problem Relation Age of Onset   COPD Mother    Healthy Father    Ovarian cancer Paternal Aunt    Colon cancer Cousin    Colon polyps Neg Hx    Esophageal cancer Neg Hx    Rectal cancer Neg Hx    Stomach cancer Neg Hx     Social History   Socioeconomic History   Marital status: Single    Spouse name: Not on file   Number of children: Not on file   Years of education: Not on file   Highest education level: Master's degree (e.g., MA, MS, MEng, MEd, MSW, MBA)  Occupational History   Not on file  Tobacco Use   Smoking status: Never    Passive exposure: Past   Smokeless tobacco: Former    Types: Snuff   Tobacco comments:    States trying to stop  Vaping Use   Vaping status: Never Used   Substance and Sexual Activity   Alcohol use: Yes    Comment: occasionally   Drug use: Never   Sexual activity: Not on file  Other Topics Concern   Not on file  Social History Narrative   Not on file   Social Drivers of Health   Tobacco Use: Medium Risk (11/09/2024)   Patient History    Smoking Tobacco Use: Never    Smokeless Tobacco Use: Former    Passive Exposure: Past  Physicist, Medical Strain: Medium Risk (11/07/2024)   Overall Financial Resource Strain (CARDIA)    Difficulty of Paying Living Expenses: Somewhat hard  Food Insecurity: Food Insecurity Present (11/07/2024)   Epic    Worried About Programme Researcher, Broadcasting/film/video in the Last Year: Sometimes true    Ran Out of Food in the Last Year: Sometimes true  Transportation Needs: No Transportation Needs (11/07/2024)   Epic    Lack of Transportation (Medical): No    Lack of Transportation (Non-Medical): No  Physical Activity: Insufficiently Active (11/07/2024)   Exercise Vital Sign    Days of Exercise per Week: 1 day    Minutes of Exercise per Session: 10 min  Stress: Stress Concern Present (11/07/2024)   [employer] of Occupational Health - Occupational Stress Questionnaire  Feeling of Stress: Very much  Social Connections: Socially Isolated (11/07/2024)   Social Connection and Isolation Panel    Frequency of Communication with Friends and Family: Once a week    Frequency of Social Gatherings with Friends and Family: Never    Attends Religious Services: 1 to 4 times per year    Active Member of [employer] or Organizations: No    Attends Engineer, Structural: Not on file    Marital Status: Never married  Intimate Partner Violence: Not At Risk (10/29/2024)   Epic    Fear of Current or Ex-Partner: No    Emotionally Abused: No    Physically Abused: No    Sexually Abused: No  Depression (PHQ2-9): Medium Risk (11/09/2024)   Depression (PHQ2-9)    PHQ-2 Score: 9  Alcohol Screen: Low Risk (11/07/2024)   Alcohol Screen     Last Alcohol Screening Score (AUDIT): 2  Housing: High Risk (11/07/2024)   Epic    Unable to Pay for Housing in the Last Year: Yes    Number of Times Moved in the Last Year: Not on file    Homeless in the Last Year: Yes  Utilities: Not At Risk (10/29/2024)   Epic    Threatened with loss of utilities: No  Health Literacy: Not on file    ROS All ROS negative except what is listed in the HPI.   Objective:   Today's Vitals: BP 131/79   Pulse (!) 103   Temp 98 F (36.7 C) (Oral)   Ht 6' 1 (1.854 m)   Wt 239 lb 9.6 oz (108.7 kg)   SpO2 95%   BMI 31.61 kg/m   Physical Exam Vitals reviewed.  Constitutional:      Appearance: Normal appearance.  Cardiovascular:     Rate and Rhythm: Normal rate and regular rhythm.     Heart sounds: Normal heart sounds.  Pulmonary:     Effort: Pulmonary effort is normal.     Breath sounds: Normal breath sounds. No wheezing, rhonchi or rales.  Skin:    General: Skin is warm and dry.  Neurological:     Mental Status: He is alert and oriented to person, place, and time.  Psychiatric:        Mood and Affect: Mood normal.        Behavior: Behavior normal.        Thought Content: Thought content normal.        Judgment: Judgment normal.       Assessment & Plan:   Problem List Items Addressed This Visit       Active Problems   Primary hypertension Stable on recheck today. Continue current regimen and monitoring at home. Follow-up if BP >140/90 consistently or symptoms develop.    Relevant Medications   Blood Pressure Monitoring (BLOOD PRESSURE UNIT) DEVI   Hemothorax on right Breathing improved, pain resolved post-drainage. Oxygen saturation at 95%. - Schedule follow-up x-ray per surgical teams orders   Other Visit Diagnoses       Hospital discharge follow-up    -  Primary Doing much better! Stable. See above/below.        Hypokalemia     Due to recheck potassium within the next week - schedule lab appointment. Other labs recently  stable and no new symptoms.    Relevant Orders   Potassium        Follow-up: Return for lab appointment in the next week to recheck potassium .   Waddell FURY  Almarie, DNP, FNP-C  I,Emily Lagle,acting as a neurosurgeon for Waddell KATHEE Almarie, NP.,have documented all relevant documentation on the behalf of Waddell KATHEE Almarie, NP.   I, Waddell KATHEE Almarie, NP, have reviewed all documentation for this visit. The documentation on 11/09/2024 for the exam, diagnosis, procedures, and orders are all accurate and complete.

## 2024-11-11 DIAGNOSIS — Z419 Encounter for procedure for purposes other than remedying health state, unspecified: Secondary | ICD-10-CM | POA: Diagnosis not present

## 2024-11-14 ENCOUNTER — Encounter (INDEPENDENT_AMBULATORY_CARE_PROVIDER_SITE_OTHER): Payer: Self-pay

## 2024-11-14 ENCOUNTER — Other Ambulatory Visit: Payer: Self-pay

## 2024-11-15 NOTE — Addendum Note (Signed)
 Addended by: ESTELLE GILLIS D on: 11/15/2024 09:38 AM   Modules accepted: Orders

## 2024-11-16 ENCOUNTER — Other Ambulatory Visit

## 2024-11-16 ENCOUNTER — Encounter: Payer: Self-pay | Admitting: Family Medicine

## 2024-11-16 DIAGNOSIS — F419 Anxiety disorder, unspecified: Secondary | ICD-10-CM

## 2024-11-18 ENCOUNTER — Ambulatory Visit
Admission: RE | Admit: 2024-11-18 | Discharge: 2024-11-18 | Disposition: A | Source: Ambulatory Visit | Attending: Physician Assistant

## 2024-11-18 ENCOUNTER — Other Ambulatory Visit (INDEPENDENT_AMBULATORY_CARE_PROVIDER_SITE_OTHER)

## 2024-11-18 ENCOUNTER — Other Ambulatory Visit

## 2024-11-18 DIAGNOSIS — S271XXA Traumatic hemothorax, initial encounter: Secondary | ICD-10-CM | POA: Diagnosis not present

## 2024-11-18 DIAGNOSIS — R7401 Elevation of levels of liver transaminase levels: Secondary | ICD-10-CM

## 2024-11-18 DIAGNOSIS — J942 Hemothorax: Secondary | ICD-10-CM

## 2024-11-18 DIAGNOSIS — R748 Abnormal levels of other serum enzymes: Secondary | ICD-10-CM | POA: Diagnosis not present

## 2024-11-18 NOTE — Addendum Note (Signed)
 Addended by: TRUDY CURVIN RAMAN on: 11/18/2024 09:05 AM   Modules accepted: Orders

## 2024-11-21 ENCOUNTER — Ambulatory Visit: Payer: Self-pay | Admitting: Family Medicine

## 2024-11-23 ENCOUNTER — Other Ambulatory Visit: Payer: Self-pay

## 2024-11-23 ENCOUNTER — Ambulatory Visit: Payer: Self-pay | Admitting: Family Medicine

## 2024-11-23 LAB — ALKALINE PHOSPHATASE, ISOENZYMES
BONE FRACTION: 37 % (ref 12–68)
INTESTINAL FRAC.: 2 % (ref 0–18)
LIVER FRACTION: 61 % (ref 13–88)

## 2024-11-23 LAB — COMPREHENSIVE METABOLIC PANEL WITH GFR
ALT: 29 IU/L (ref 0–44)
AST: 43 IU/L — ABNORMAL HIGH (ref 0–40)
Albumin: 4.5 g/dL (ref 4.1–5.1)
Alkaline Phosphatase: 94 IU/L (ref 47–123)
BUN/Creatinine Ratio: 7 — ABNORMAL LOW (ref 9–20)
BUN: 7 mg/dL (ref 6–24)
Bilirubin Total: 0.6 mg/dL (ref 0.0–1.2)
CO2: 23 mmol/L (ref 20–29)
Calcium: 9.8 mg/dL (ref 8.7–10.2)
Chloride: 97 mmol/L (ref 96–106)
Creatinine, Ser: 0.97 mg/dL (ref 0.76–1.27)
Globulin, Total: 3.5 g/dL (ref 1.5–4.5)
Glucose: 94 mg/dL (ref 70–99)
Potassium: 3.6 mmol/L (ref 3.5–5.2)
Sodium: 135 mmol/L (ref 134–144)
Total Protein: 8 g/dL (ref 6.0–8.5)
eGFR: 98 mL/min/1.73

## 2024-11-25 ENCOUNTER — Other Ambulatory Visit: Payer: Self-pay

## 2024-11-25 NOTE — Progress Notes (Signed)
 Specialty Pharmacy Refill Coordination Note  Zachary [name] is a 43 y.o. male contacted today regarding refills of specialty medication(s) Upadacitinib  (Rinvoq )   Patient requested Delivery   Delivery date: 11/29/24   Verified address: [address]   Medication will be filled on: 11/28/24

## 2024-11-28 ENCOUNTER — Other Ambulatory Visit: Payer: Self-pay

## 2024-11-29 ENCOUNTER — Other Ambulatory Visit: Payer: Self-pay | Admitting: Family Medicine

## 2024-11-29 DIAGNOSIS — F419 Anxiety disorder, unspecified: Secondary | ICD-10-CM

## 2024-11-30 ENCOUNTER — Other Ambulatory Visit (HOSPITAL_COMMUNITY): Payer: Self-pay

## 2024-11-30 ENCOUNTER — Other Ambulatory Visit: Payer: Self-pay

## 2024-11-30 MED ORDER — SERTRALINE HCL 100 MG PO TABS
200.0000 mg | ORAL_TABLET | Freq: Every day | ORAL | 1 refills | Status: AC
  Filled 2024-11-30: qty 180, 90d supply, fill #0

## 2024-12-14 ENCOUNTER — Telehealth: Payer: Self-pay | Admitting: Physician Assistant

## 2024-12-14 NOTE — Telephone Encounter (Signed)
 Repeat CXR reviewed.  No pleural effusion or pneumothorax to suggest residual hemothorax or pneumothorax. I attempted to call the patient to relay these results without answer.  Noted that patients PCP has already placed results notes in chart and note from [facility] reports patient has reviewed results and recommendations.  Patient may follow up with our group as needed.   Ozell CHRISTELLA Shaper , [facility] 12/14/2024, 2:33 PM Please see Amion for pager number during day hours 7:00am-4:30pm

## 2024-12-21 ENCOUNTER — Other Ambulatory Visit: Payer: Self-pay

## 2024-12-22 ENCOUNTER — Other Ambulatory Visit: Payer: Self-pay

## 2024-12-22 NOTE — Progress Notes (Signed)
 Specialty Pharmacy Refill Coordination Note  Zachary [name] is a 44 y.o. male contacted today regarding refills of specialty medication(s) Upadacitinib  (Rinvoq )   Patient requested (Patient-Rptd) Delivery   Delivery date: 12/23/24   Verified address: (Patient-Rptd) [address] [place], [place] [address]   Medication will be filled on: 12/22/24

## 2025-02-02 ENCOUNTER — Ambulatory Visit: Payer: Self-pay | Admitting: Psychology

## 2025-05-11 ENCOUNTER — Encounter: Admitting: Family Medicine
